# Patient Record
Sex: Male | Born: 1950 | Hispanic: No | Marital: Married | Smoking: Never smoker
Health system: Southern US, Community
[De-identification: ages and names within clinical notes are randomized; demographics above are authoritative.]

## PROBLEM LIST (undated history)

## (undated) DIAGNOSIS — K219 Gastro-esophageal reflux disease without esophagitis: Secondary | ICD-10-CM

---

## 2008-10-08 ENCOUNTER — Ambulatory Visit: Payer: Self-pay | Admitting: Diagnostic Radiology

## 2008-10-08 ENCOUNTER — Emergency Department (HOSPITAL_BASED_OUTPATIENT_CLINIC_OR_DEPARTMENT_OTHER): Admission: EM | Admit: 2008-10-08 | Discharge: 2008-10-08 | Payer: Self-pay | Admitting: Emergency Medicine

## 2010-07-01 ENCOUNTER — Encounter: Payer: Self-pay | Admitting: Specialist

## 2010-09-22 ENCOUNTER — Emergency Department (INDEPENDENT_AMBULATORY_CARE_PROVIDER_SITE_OTHER): Payer: Self-pay

## 2010-09-22 ENCOUNTER — Emergency Department (HOSPITAL_BASED_OUTPATIENT_CLINIC_OR_DEPARTMENT_OTHER)
Admission: EM | Admit: 2010-09-22 | Discharge: 2010-09-22 | Disposition: A | Discharge status: Home / Self Care | Payer: Self-pay | Attending: Emergency Medicine | Admitting: Emergency Medicine

## 2010-09-22 DIAGNOSIS — J45901 Unspecified asthma with (acute) exacerbation: Secondary | ICD-10-CM | POA: Insufficient documentation

## 2010-09-22 DIAGNOSIS — R0682 Tachypnea, not elsewhere classified: Secondary | ICD-10-CM | POA: Insufficient documentation

## 2010-09-22 DIAGNOSIS — R070 Pain in throat: Secondary | ICD-10-CM | POA: Insufficient documentation

## 2010-09-22 DIAGNOSIS — Z79899 Other long term (current) drug therapy: Secondary | ICD-10-CM | POA: Insufficient documentation

## 2010-09-22 DIAGNOSIS — R042 Hemoptysis: Secondary | ICD-10-CM | POA: Insufficient documentation

## 2010-09-22 DIAGNOSIS — R079 Chest pain, unspecified: Secondary | ICD-10-CM | POA: Insufficient documentation

## 2010-09-22 DIAGNOSIS — R0989 Other specified symptoms and signs involving the circulatory and respiratory systems: Secondary | ICD-10-CM | POA: Insufficient documentation

## 2010-09-22 DIAGNOSIS — R0609 Other forms of dyspnea: Secondary | ICD-10-CM | POA: Insufficient documentation

## 2010-09-22 DIAGNOSIS — J309 Allergic rhinitis, unspecified: Secondary | ICD-10-CM | POA: Insufficient documentation

## 2010-09-22 DIAGNOSIS — K219 Gastro-esophageal reflux disease without esophagitis: Secondary | ICD-10-CM | POA: Insufficient documentation

## 2010-09-22 DIAGNOSIS — R0602 Shortness of breath: Secondary | ICD-10-CM

## 2010-09-22 LAB — POCT I-STAT, CHEM 8
Creatinine, Ser: 1.1 mg/dL (ref 0.4–1.5)
Hemoglobin: 15.6 g/dL (ref 13.0–17.0)
Potassium: 4 mEq/L (ref 3.5–5.1)
Sodium: 140 mEq/L (ref 135–145)

## 2010-09-22 LAB — D-DIMER, QUANTITATIVE: D-Dimer, Quant: <0.22 ug/mL-FEU (ref 0.00–0.48)

## 2010-09-22 LAB — DIFFERENTIAL
Basophils Relative: 0 % (ref 0–1)
Eosinophils Absolute: 0.5 10*3/uL (ref 0.0–0.7)
Lymphocytes Relative: 46 % (ref 12–46)
Monocytes Absolute: 0.7 10*3/uL (ref 0.1–1.0)
Neutro Abs: 2.6 10*3/uL (ref 1.7–7.7)

## 2010-09-22 LAB — CBC
MCH: 29.8 pg (ref 26.0–34.0)
MCV: 84.2 fL (ref 78.0–100.0)
Platelets: 238 10*3/uL (ref 150–400)
RBC: 5.07 MIL/uL (ref 4.22–5.81)
RDW: 12.8 % (ref 11.5–15.5)

## 2010-09-22 MED ORDER — IOHEXOL 350 MG/ML SOLN
100.0000 mL | Freq: Once | INTRAVENOUS | Status: AC | PRN
Start: 2010-09-22 — End: 2010-09-22
  Administered 2010-09-22: 100 mL via INTRAVENOUS

## 2010-12-30 ENCOUNTER — Emergency Department (INDEPENDENT_AMBULATORY_CARE_PROVIDER_SITE_OTHER): Payer: Self-pay

## 2010-12-30 ENCOUNTER — Encounter: Payer: Self-pay | Admitting: Student

## 2010-12-30 ENCOUNTER — Emergency Department (HOSPITAL_BASED_OUTPATIENT_CLINIC_OR_DEPARTMENT_OTHER)
Admission: EM | Admit: 2010-12-30 | Discharge: 2010-12-30 | Disposition: A | Discharge status: Home / Self Care | Payer: Self-pay | Attending: Emergency Medicine | Admitting: Emergency Medicine

## 2010-12-30 DIAGNOSIS — R0602 Shortness of breath: Secondary | ICD-10-CM

## 2010-12-30 DIAGNOSIS — J45901 Unspecified asthma with (acute) exacerbation: Secondary | ICD-10-CM | POA: Insufficient documentation

## 2010-12-30 DIAGNOSIS — R05 Cough: Secondary | ICD-10-CM

## 2010-12-30 MED ORDER — IPRATROPIUM BROMIDE 0.02 % IN SOLN
0.5000 mg | Freq: Once | RESPIRATORY_TRACT | Status: AC
  Administered 2010-12-30: 0.5 mg via RESPIRATORY_TRACT
  Filled 2010-12-30: qty 2.5

## 2010-12-30 MED ORDER — ALBUTEROL SULFATE HFA 108 (90 BASE) MCG/ACT IN AERS
2.0000 | INHALATION_SPRAY | Freq: Once | RESPIRATORY_TRACT | Status: AC
  Administered 2010-12-30: 2 via RESPIRATORY_TRACT

## 2010-12-30 MED ORDER — PREDNISONE 20 MG PO TABS
50.0000 mg | ORAL_TABLET | Freq: Once | ORAL | Status: AC
  Administered 2010-12-30: 50 mg via ORAL
  Filled 2010-12-30: qty 2

## 2010-12-30 MED ORDER — PREDNISONE 10 MG PO TABS: ORAL_TABLET | ORAL | Status: DC

## 2010-12-30 MED ORDER — ALBUTEROL SULFATE (5 MG/ML) 0.5% IN NEBU
2.5000 mg | INHALATION_SOLUTION | Freq: Once | RESPIRATORY_TRACT | Status: AC
  Administered 2010-12-30: 5 mg via RESPIRATORY_TRACT
  Filled 2010-12-30: qty 0.5

## 2010-12-30 NOTE — ED Provider Notes (Signed)
History     Chief Complaint  Patient presents with  . Shortness of Breath   Patient is a 60 y.o. male presenting with shortness of breath. The history is provided by the patient. No language interpreter was used.  Shortness of Breath  The current episode started 2 days ago. The onset was sudden. The problem occurs occasionally. The problem has been gradually worsening (Episodes are occurring more frequently). The problem is moderate. The symptoms are relieved by beta-agonist inhalers. The symptoms are aggravated by nothing. Associated symptoms include shortness of breath and wheezing. Pertinent negatives include no chest pain, no fever, no rhinorrhea and no sore throat. There was no intake of a foreign body. He was not exposed to toxic fumes. He has not inhaled smoke recently. He has had intermittent steroid use (Patient last used steroids several months ago.). He has had no prior hospitalizations. He has had no prior ICU admissions. He has had no prior intubations. His past medical history is significant for past wheezing. His past medical history does not include asthma. He has been behaving normally. Urine output has been normal. There were no sick contacts. He has received no recent medical care.    Past Medical History  Diagnosis Date  . Asthma     History reviewed. No pertinent past surgical history.  History reviewed. No pertinent family history.  History  Substance Use Topics  . Smoking status: Never Smoker   . Smokeless tobacco: Never Used  . Alcohol Use: No      Review of Systems  Constitutional: Negative.  Negative for fever.  HENT: Negative.  Negative for sore throat and rhinorrhea.   Eyes: Negative.   Respiratory: Positive for shortness of breath and wheezing.   Cardiovascular: Negative for chest pain, palpitations and leg swelling.  Gastrointestinal: Negative.   Genitourinary: Negative.   Musculoskeletal: Negative.   Skin: Negative.   Neurological: Negative.     Hematological: Negative.   Psychiatric/Behavioral: Negative.   All other systems reviewed and are negative.    Physical Exam  BP 114/75  Pulse 81  Temp(Src) 98.9 F (37.2 C) (Oral)  Resp 20  SpO2 97%  Physical Exam  Nursing note and vitals reviewed. Constitutional: He is oriented to person, place, and time. He appears well-developed and well-nourished. No distress.  HENT:  Head: Normocephalic and atraumatic.  Eyes: Conjunctivae and EOM are normal. Pupils are equal, round, and reactive to light.  Neck: Normal range of motion.  Cardiovascular: Normal rate, regular rhythm and normal heart sounds.  Exam reveals no gallop and no friction rub.   No murmur heard. Pulmonary/Chest: Effort normal. No respiratory distress. He has wheezes.  Abdominal: Soft. Bowel sounds are normal. He exhibits no distension. There is no tenderness. There is no rebound and no guarding.  Musculoskeletal: Normal range of motion.  Neurological: He is alert and oriented to person, place, and time. No cranial nerve deficit.  Skin: Skin is warm and dry. No rash noted.  Psychiatric: He has a normal mood and affect.    ED Course  Procedures  MDM  Patient was examined by myself. Patient does not have a history of asthma that has been treated at this facility for bronchospasm in the past. Patient said he began having increasing frequency of bronchospastic attacks a couple of days ago. He has not been on any recent steroids. He denied any fevers. Patient does have a history of reflux and has been taking his medications for this. Patient has coughed up some  white phlegm with these episodes. A chest x-ray was ordered and was within normal limits. Patient did have prednisone 50 mg by mouth as well as 1 albuterol and Atrovent nebulizer treatment here. With this the patient said he was feeling much better. He had no audible wheezing on repeat assessment by myself. Patient was comfortable being discharged home. I did give  the patient an inhaler here and another 2 puffs of albuterol. He was also prescribed a prednisone taper. Patient was told that he should followup with his primary care physician in case additional controller medication should be added. The patient was discharged home in good condition.  Assessment: 60 year old male who presents with bronchospasm likely rate related to reactive airways disease.  Plan: Discharge home with albuterol, prednisone, and plan as dictated previously.     Aracelia Brinson 12/30/10 1703

## 2010-12-30 NOTE — ED Notes (Signed)
Pt in with c/o multiple asthma attacks unrelieved by multiple inhaler usage. Airway patent and intact, no cyanosis noted.

## 2011-10-29 ENCOUNTER — Emergency Department (HOSPITAL_BASED_OUTPATIENT_CLINIC_OR_DEPARTMENT_OTHER)
Admission: EM | Admit: 2011-10-29 | Discharge: 2011-10-29 | Disposition: A | Discharge status: Home / Self Care | Payer: Self-pay | Attending: Emergency Medicine | Admitting: Emergency Medicine

## 2011-10-29 ENCOUNTER — Encounter (HOSPITAL_BASED_OUTPATIENT_CLINIC_OR_DEPARTMENT_OTHER): Payer: Self-pay | Admitting: *Deleted

## 2011-10-29 ENCOUNTER — Emergency Department (HOSPITAL_BASED_OUTPATIENT_CLINIC_OR_DEPARTMENT_OTHER): Payer: Self-pay

## 2011-10-29 DIAGNOSIS — R0989 Other specified symptoms and signs involving the circulatory and respiratory systems: Secondary | ICD-10-CM | POA: Insufficient documentation

## 2011-10-29 DIAGNOSIS — R0602 Shortness of breath: Secondary | ICD-10-CM | POA: Insufficient documentation

## 2011-10-29 DIAGNOSIS — R0609 Other forms of dyspnea: Secondary | ICD-10-CM | POA: Insufficient documentation

## 2011-10-29 DIAGNOSIS — J45901 Unspecified asthma with (acute) exacerbation: Secondary | ICD-10-CM | POA: Insufficient documentation

## 2011-10-29 LAB — DIFFERENTIAL
Basophils Absolute: 0 10*3/uL (ref 0.0–0.1)
Basophils Relative: 0 % (ref 0–1)
Eosinophils Absolute: 0.4 10*3/uL (ref 0.0–0.7)
Eosinophils Relative: 4 % (ref 0–5)
Neutrophils Relative %: 65 % (ref 43–77)

## 2011-10-29 LAB — COMPREHENSIVE METABOLIC PANEL
ALT: 11 U/L (ref 0–53)
Albumin: 4.1 g/dL (ref 3.5–5.2)
Calcium: 9.2 mg/dL (ref 8.4–10.5)
GFR calc Af Amer: >90 mL/min (ref >90)
Glucose, Bld: 105 mg/dL — ABNORMAL HIGH (ref 70–99)
Potassium: 3.6 mEq/L (ref 3.5–5.1)
Sodium: 136 mEq/L (ref 135–145)
Total Protein: 7.1 g/dL (ref 6.0–8.3)

## 2011-10-29 LAB — CBC
MCH: 31.2 pg (ref 26.0–34.0)
MCHC: 35.9 g/dL (ref 30.0–36.0)
MCV: 86.8 fL (ref 78.0–100.0)
Platelets: 217 10*3/uL (ref 150–400)
RDW: 12.8 % (ref 11.5–15.5)

## 2011-10-29 MED ORDER — ALBUTEROL SULFATE (5 MG/ML) 0.5% IN NEBU
INHALATION_SOLUTION | RESPIRATORY_TRACT | Status: AC
  Filled 2011-10-29: qty 0.5

## 2011-10-29 MED ORDER — IPRATROPIUM BROMIDE 0.02 % IN SOLN
RESPIRATORY_TRACT | Status: AC
  Administered 2011-10-29: 0.5 mg via RESPIRATORY_TRACT
  Filled 2011-10-29: qty 2.5

## 2011-10-29 MED ORDER — METHYLPREDNISOLONE SODIUM SUCC 125 MG IJ SOLR
125.0000 mg | Freq: Once | INTRAMUSCULAR | Status: AC
  Administered 2011-10-29: 125 mg via INTRAVENOUS
  Filled 2011-10-29: qty 2

## 2011-10-29 MED ORDER — IPRATROPIUM BROMIDE 0.02 % IN SOLN
0.5000 mg | Freq: Once | RESPIRATORY_TRACT | Status: AC
  Administered 2011-10-29: 0.5 mg via RESPIRATORY_TRACT

## 2011-10-29 MED ORDER — ALBUTEROL SULFATE (5 MG/ML) 0.5% IN NEBU
5.0100 mg | INHALATION_SOLUTION | Freq: Once | RESPIRATORY_TRACT | Status: AC
  Administered 2011-10-29: 5 mg via RESPIRATORY_TRACT

## 2011-10-29 MED ORDER — PREDNISONE 10 MG PO TABS: 20.0000 mg | ORAL_TABLET | Freq: Two times a day (BID) | ORAL | Status: DC

## 2011-10-29 MED ORDER — ALBUTEROL SULFATE (5 MG/ML) 0.5% IN NEBU
INHALATION_SOLUTION | RESPIRATORY_TRACT | Status: AC
  Administered 2011-10-29: 5 mg via RESPIRATORY_TRACT
  Filled 2011-10-29: qty 1

## 2011-10-29 MED ORDER — AZITHROMYCIN 250 MG PO TABS: 250.0000 mg | ORAL_TABLET | Freq: Every day | ORAL | Status: AC

## 2011-10-29 MED ORDER — ALBUTEROL SULFATE (2.5 MG/3ML) 0.083% IN NEBU: 2.5000 mg | INHALATION_SOLUTION | Freq: Four times a day (QID) | RESPIRATORY_TRACT | Status: DC | PRN

## 2011-10-29 MED ORDER — ALBUTEROL SULFATE (5 MG/ML) 0.5% IN NEBU
10.0000 mg | INHALATION_SOLUTION | RESPIRATORY_TRACT | Status: DC
  Administered 2011-10-29: 10 mg via RESPIRATORY_TRACT

## 2011-10-29 NOTE — ED Notes (Signed)
C/o asthma exacerbation, no relief from inhalers

## 2011-10-29 NOTE — ED Provider Notes (Signed)
History     CSN: [id number]  Arrival date & time 10/29/11  2426   First MD Initiated Contact with Patient 10/29/11 0703      Chief Complaint  Patient presents with  . Asthma    (Consider location/radiation/quality/duration/timing/severity/associated sxs/prior treatment) HPI Comments: History of Asthma.  Flares up every so often.  On steroids a couple months back.  Has cough which is productive.  Patient is a 61 y.o. male presenting with asthma. The history is provided by the patient.  Asthma This is a recurrent problem. The current episode started 2 days ago. The problem occurs constantly. The problem has been gradually worsening. Associated symptoms include shortness of breath. Pertinent negatives include no chest pain. The symptoms are aggravated by nothing. The symptoms are relieved by nothing. Treatments tried: inhalers. The treatment provided mild relief.    Past Medical History  Diagnosis Date  . Asthma     History reviewed. No pertinent past surgical history.  History reviewed. No pertinent family history.  History  Substance Use Topics  . Smoking status: Never Smoker   . Smokeless tobacco: Never Used  . Alcohol Use: No      Review of Systems  Respiratory: Positive for shortness of breath.   Cardiovascular: Negative for chest pain.  All other systems reviewed and are negative.    Allergies  Review of patient's allergies indicates no known allergies.  Home Medications   Current Outpatient Rx  Name Route Sig Dispense Refill  . VENTOLIN HFA IN Inhalation Inhale into the lungs.      . ESOMEPRAZOLE MAGNESIUM 40 MG PO CPDR Oral Take 40 mg by mouth daily before breakfast.      . COMBIVENT IN Inhalation Inhale into the lungs.      Marland Kitchen PREDNISONE 10 MG PO TABS  Take 5 tabs po qday x 2 days, then 4 tabs po qday x 2 days, then 3 tabs po qday x 2 days, then 2 tabs po qday x 2 days, then 1 tab po qday x 2 days, then 1/2 tab po qday x 2 days, then STOP. 31 tablet 0     BP 145/87  Pulse 104  Temp(Src) 98.1 F (36.7 C) (Oral)  Resp 26  SpO2 96%  Physical Exam  Nursing note and vitals reviewed. Constitutional: He is oriented to person, place, and time. He appears well-developed and well-nourished. No distress.  HENT:  Head: Normocephalic and atraumatic.  Mouth/Throat: Oropharynx is clear and moist.  Neck: Normal range of motion. Neck supple.  Cardiovascular: Normal rate and regular rhythm.   Pulmonary/Chest:       Mild respiratory distress.  Rhonchorous breath sounds throughout.  Abdominal: Soft. Bowel sounds are normal. He exhibits no distension. There is no tenderness.  Musculoskeletal: Normal range of motion. He exhibits no edema.  Lymphadenopathy:    He has no cervical adenopathy.  Neurological: He is alert and oriented to person, place, and time.  Skin: Skin is warm and dry. He is not diaphoretic.    ED Course  Procedures (including critical care time)   Labs Reviewed  CBC  DIFFERENTIAL  COMPREHENSIVE METABOLIC PANEL  PRO B NATRIURETIC PEPTIDE  TROPONIN I   No results found.   No diagnosis found.    MDM  The patient appears better and is feeling better after the neb.  The chest xray and labs are all consistent with bronchitis/rad exac rather than a pneumonia or cardiac etiology.  Will treat with bronchodilators and try to arrange for  a home neb machine.  Will also prescribe prednisone and zmax.  To return prn.        Geoffery Lyons, MD 10/29/11 [phone]

## 2011-10-29 NOTE — ED Notes (Signed)
Report received from [place], [place], care assumed.  Dr. Judd Lien at bedside.

## 2011-10-29 NOTE — ED Notes (Signed)
Report to Amy, RN

## 2011-10-29 NOTE — Discharge Instructions (Signed)

## 2011-10-29 NOTE — ED Notes (Signed)
Pt continues to receive neb tx and improving.

## 2011-11-25 ENCOUNTER — Emergency Department (HOSPITAL_BASED_OUTPATIENT_CLINIC_OR_DEPARTMENT_OTHER): Payer: Self-pay

## 2011-11-25 ENCOUNTER — Encounter (HOSPITAL_BASED_OUTPATIENT_CLINIC_OR_DEPARTMENT_OTHER): Payer: Self-pay | Admitting: *Deleted

## 2011-11-25 ENCOUNTER — Emergency Department (HOSPITAL_BASED_OUTPATIENT_CLINIC_OR_DEPARTMENT_OTHER)
Admission: EM | Admit: 2011-11-25 | Discharge: 2011-11-25 | Disposition: A | Discharge status: Home / Self Care | Payer: Self-pay | Attending: Emergency Medicine | Admitting: Emergency Medicine

## 2011-11-25 DIAGNOSIS — K219 Gastro-esophageal reflux disease without esophagitis: Secondary | ICD-10-CM | POA: Insufficient documentation

## 2011-11-25 DIAGNOSIS — J45909 Unspecified asthma, uncomplicated: Secondary | ICD-10-CM | POA: Insufficient documentation

## 2011-11-25 DIAGNOSIS — Z79899 Other long term (current) drug therapy: Secondary | ICD-10-CM | POA: Insufficient documentation

## 2011-11-25 HISTORY — DX: Gastro-esophageal reflux disease without esophagitis: K21.9

## 2011-11-25 MED ORDER — PANTOPRAZOLE SODIUM 20 MG PO TBEC: 40.0000 mg | DELAYED_RELEASE_TABLET | Freq: Every day | ORAL | Status: DC

## 2011-11-25 MED ORDER — ALBUTEROL SULFATE (5 MG/ML) 0.5% IN NEBU
10.0000 mg | INHALATION_SOLUTION | Freq: Once | RESPIRATORY_TRACT | Status: AC
  Administered 2011-11-25: 10 mg via RESPIRATORY_TRACT
  Filled 2011-11-25: qty 2

## 2011-11-25 MED ORDER — RACEPINEPHRINE HCL 2.25 % IN NEBU
INHALATION_SOLUTION | RESPIRATORY_TRACT | Status: AC
  Administered 2011-11-25: 0.5 mL via RESPIRATORY_TRACT
  Filled 2011-11-25: qty 0.5

## 2011-11-25 MED ORDER — ALBUTEROL SULFATE (5 MG/ML) 0.5% IN NEBU
INHALATION_SOLUTION | RESPIRATORY_TRACT | Status: AC
  Filled 2011-11-25: qty 0.5

## 2011-11-25 MED ORDER — RACEPINEPHRINE HCL 2.25 % IN NEBU: 0.5000 mL | INHALATION_SOLUTION | RESPIRATORY_TRACT | Status: DC | PRN

## 2011-11-25 MED ORDER — IPRATROPIUM BROMIDE 0.02 % IN SOLN
RESPIRATORY_TRACT | Status: AC
  Filled 2011-11-25: qty 2.5

## 2011-11-25 MED ORDER — PREDNISONE 50 MG PO TABS
60.0000 mg | ORAL_TABLET | Freq: Once | ORAL | Status: AC
  Administered 2011-11-25: 60 mg via ORAL
  Filled 2011-11-25: qty 1

## 2011-11-25 MED ORDER — RACEPINEPHRINE HCL 2.25 % IN NEBU
0.5000 mL | INHALATION_SOLUTION | Freq: Once | RESPIRATORY_TRACT | Status: AC
  Administered 2011-11-25: 0.5 mL via RESPIRATORY_TRACT

## 2011-11-25 MED ORDER — METHYLPREDNISOLONE SODIUM SUCC 125 MG IJ SOLR
125.0000 mg | Freq: Once | INTRAMUSCULAR | Status: AC
  Administered 2011-11-25: 125 mg via INTRAVENOUS
  Filled 2011-11-25: qty 2

## 2011-11-25 MED ORDER — IPRATROPIUM BROMIDE 0.02 % IN SOLN
0.5000 mg | Freq: Once | RESPIRATORY_TRACT | Status: AC
  Administered 2011-11-25: 0.5 mg via RESPIRATORY_TRACT

## 2011-11-25 MED ORDER — PREDNISONE 20 MG PO TABS: 40.0000 mg | ORAL_TABLET | Freq: Every day | ORAL | Status: AC

## 2011-11-25 NOTE — ED Notes (Signed)
MD at bedside. 

## 2011-11-25 NOTE — ED Provider Notes (Signed)
History     CSN: [id number]  Arrival date & time 11/25/11  1219   First MD Initiated Contact with Patient 11/25/11 1231      Chief Complaint  Patient presents with  . Asthma  . Shortness of Breath    (Consider location/radiation/quality/duration/timing/severity/associated sxs/prior treatment) HPI Patient with known history of asthma comes in with increasing wheezing and shortness of breath.  Patient albuterol at home with no improvement. Past Medical History  Diagnosis Date  . Asthma   . GERD (gastroesophageal reflux disease)     History reviewed. No pertinent past surgical history.  History reviewed. No pertinent family history.  History  Substance Use Topics  . Smoking status: Never Smoker   . Smokeless tobacco: Never Used  . Alcohol Use: No      Review of Systems  Unable to perform ROS   Allergies  Review of patient's allergies indicates no known allergies.  Home Medications   Current Outpatient Rx  Name Route Sig Dispense Refill  . FLUTICASONE-SALMETEROL 100-50 MCG/DOSE IN AEPB Inhalation Inhale 1 puff into the lungs every 12 (twelve) hours.    . ALBUTEROL SULFATE (2.5 MG/3ML) 0.083% IN NEBU Nebulization Take 3 mLs (2.5 mg total) by nebulization every 6 (six) hours as needed for wheezing. 25 mL 12  . VENTOLIN HFA IN Inhalation Inhale into the lungs.      . ESOMEPRAZOLE MAGNESIUM 40 MG PO CPDR Oral Take 40 mg by mouth daily before breakfast.      . COMBIVENT IN Inhalation Inhale into the lungs.      Marland Kitchen PANTOPRAZOLE SODIUM 20 MG PO TBEC Oral Take 2 tablets (40 mg total) by mouth daily. 15 tablet 1  . PREDNISONE 10 MG PO TABS  Take 5 tabs po qday x 2 days, then 4 tabs po qday x 2 days, then 3 tabs po qday x 2 days, then 2 tabs po qday x 2 days, then 1 tab po qday x 2 days, then 1/2 tab po qday x 2 days, then STOP. 31 tablet 0  . PREDNISONE 10 MG PO TABS Oral Take 2 tablets (20 mg total) by mouth 2 (two) times daily. 20 tablet 0  . PREDNISONE 20 MG PO TABS Oral  Take 2 tablets (40 mg total) by mouth daily. 10 tablet 0  . RACEPINEPHRINE HCL 2.25 % IN NEBU Nebulization Take 0.5 mLs by nebulization every 4 (four) hours as needed. Mix with 2cc normal saline 15 mL 0    BP 116/68  Pulse 102  SpO2 96%  Physical Exam  Nursing note and vitals reviewed. Constitutional: He is oriented to person, place, and time. He appears well-developed and well-nourished. No distress.  HENT:  Head: Normocephalic and atraumatic.  Eyes: Pupils are equal, round, and reactive to light.  Neck: Normal range of motion.  Cardiovascular: Normal rate and intact distal pulses.   Pulmonary/Chest: Accessory muscle usage present. Tachypnea noted. He is in respiratory distress. He has wheezes.  Abdominal: Normal appearance. He exhibits no distension.  Musculoskeletal: Normal range of motion.  Neurological: He is alert and oriented to person, place, and time. No cranial nerve deficit.  Skin: Skin is warm and dry. No rash noted.  Psychiatric: He has a normal mood and affect. His behavior is normal.   Scheduled Meds:    . albuterol  10 mg Nebulization Once  . ipratropium  0.5 mg Nebulization Once  . methylPREDNISolone (SOLU-MEDROL) injection  125 mg Intravenous Once  . predniSONE  60 mg Oral Once  .  Racepinephrine HCl  0.5 mL Nebulization Once  . Racepinephrine HCl  0.5 mL Nebulization Once  . DISCONTD: albuterol      . DISCONTD: ipratropium       Continuous Infusions:  PRN Meds:.  ED Course  Procedures (including critical care time)  Labs Reviewed - No data to display Dg Chest 2 View  11/25/2011  *RADIOLOGY REPORT*  Clinical Data:  Shortness of breath, cough  CHEST - 2 VIEW  Comparison: 10/29/2011  Findings: Cardiomediastinal silhouette is stable.  Mild hyperinflation again noted.  Central mild bronchitic changes.  No acute infiltrate or pulmonary edema.  Stable mild degenerative changes thoracic spine.  IMPRESSION: Mild hyperinflation.  No acute infiltrate or pulmonary  edema. Central mild bronchitic changes.  Original Report Authenticated By: Natasha Mead, M.D.     1. Asthma       MDM  After treatment in the ED the patient feels back to baseline and wants to go home.        Nelia Shi, MD 11/25/11 [phone]

## 2011-11-25 NOTE — ED Notes (Signed)
Up all night coughing and using nebulizer continued to get more short of breath and wheeze worse today denies any fever or chills

## 2012-01-01 ENCOUNTER — Institutional Professional Consult (permissible substitution): Payer: Self-pay | Admitting: Pulmonary Disease

## 2012-12-09 ENCOUNTER — Encounter (HOSPITAL_BASED_OUTPATIENT_CLINIC_OR_DEPARTMENT_OTHER): Payer: Self-pay | Admitting: *Deleted

## 2012-12-09 ENCOUNTER — Emergency Department (HOSPITAL_BASED_OUTPATIENT_CLINIC_OR_DEPARTMENT_OTHER)
Admission: EM | Admit: 2012-12-09 | Discharge: 2012-12-09 | Disposition: A | Discharge status: Home / Self Care | Payer: 59 | Attending: Emergency Medicine | Admitting: Emergency Medicine

## 2012-12-09 ENCOUNTER — Emergency Department (HOSPITAL_BASED_OUTPATIENT_CLINIC_OR_DEPARTMENT_OTHER): Payer: 59

## 2012-12-09 DIAGNOSIS — S336XXA Sprain of sacroiliac joint, initial encounter: Secondary | ICD-10-CM | POA: Insufficient documentation

## 2012-12-09 DIAGNOSIS — S39012A Strain of muscle, fascia and tendon of lower back, initial encounter: Secondary | ICD-10-CM

## 2012-12-09 DIAGNOSIS — J45909 Unspecified asthma, uncomplicated: Secondary | ICD-10-CM | POA: Insufficient documentation

## 2012-12-09 DIAGNOSIS — Y92410 Unspecified street and highway as the place of occurrence of the external cause: Secondary | ICD-10-CM | POA: Insufficient documentation

## 2012-12-09 DIAGNOSIS — S40019A Contusion of unspecified shoulder, initial encounter: Secondary | ICD-10-CM | POA: Insufficient documentation

## 2012-12-09 DIAGNOSIS — S8010XA Contusion of unspecified lower leg, initial encounter: Secondary | ICD-10-CM | POA: Insufficient documentation

## 2012-12-09 DIAGNOSIS — Y9389 Activity, other specified: Secondary | ICD-10-CM | POA: Insufficient documentation

## 2012-12-09 DIAGNOSIS — Z7901 Long term (current) use of anticoagulants: Secondary | ICD-10-CM | POA: Insufficient documentation

## 2012-12-09 DIAGNOSIS — K219 Gastro-esophageal reflux disease without esophagitis: Secondary | ICD-10-CM | POA: Insufficient documentation

## 2012-12-09 DIAGNOSIS — T07XXXA Unspecified multiple injuries, initial encounter: Secondary | ICD-10-CM

## 2012-12-09 DIAGNOSIS — R079 Chest pain, unspecified: Secondary | ICD-10-CM | POA: Insufficient documentation

## 2012-12-09 DIAGNOSIS — Z79899 Other long term (current) drug therapy: Secondary | ICD-10-CM | POA: Insufficient documentation

## 2012-12-09 MED ORDER — HYDROCODONE-ACETAMINOPHEN 5-325 MG PO TABS: 2.0000 | ORAL_TABLET | ORAL | Status: DC | PRN
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
## 2012-12-09 NOTE — ED Notes (Signed)
Pt also reports left shoulder "burning".

## 2012-12-13 ENCOUNTER — Encounter (HOSPITAL_BASED_OUTPATIENT_CLINIC_OR_DEPARTMENT_OTHER): Payer: Self-pay | Admitting: *Deleted

## 2012-12-13 ENCOUNTER — Ambulatory Visit (INDEPENDENT_AMBULATORY_CARE_PROVIDER_SITE_OTHER): Payer: Self-pay | Admitting: Family Medicine

## 2012-12-13 ENCOUNTER — Emergency Department (HOSPITAL_BASED_OUTPATIENT_CLINIC_OR_DEPARTMENT_OTHER)
Admission: EM | Admit: 2012-12-13 | Discharge: 2012-12-13 | Disposition: A | Discharge status: Home / Self Care | Payer: 59 | Attending: Emergency Medicine | Admitting: Emergency Medicine

## 2012-12-13 ENCOUNTER — Encounter: Payer: Self-pay | Admitting: Family Medicine

## 2012-12-13 VITALS — BP 117/78 | HR 58 | Ht 66.0 in | Wt 146.0 lb

## 2012-12-13 DIAGNOSIS — Z79899 Other long term (current) drug therapy: Secondary | ICD-10-CM | POA: Insufficient documentation

## 2012-12-13 DIAGNOSIS — Z87828 Personal history of other (healed) physical injury and trauma: Secondary | ICD-10-CM | POA: Insufficient documentation

## 2012-12-13 DIAGNOSIS — M545 Low back pain, unspecified: Secondary | ICD-10-CM

## 2012-12-13 DIAGNOSIS — M5441 Lumbago with sciatica, right side: Secondary | ICD-10-CM

## 2012-12-13 DIAGNOSIS — R52 Pain, unspecified: Secondary | ICD-10-CM | POA: Insufficient documentation

## 2012-12-13 DIAGNOSIS — M5430 Sciatica, unspecified side: Secondary | ICD-10-CM

## 2012-12-13 DIAGNOSIS — IMO0002 Reserved for concepts with insufficient information to code with codable children: Secondary | ICD-10-CM | POA: Insufficient documentation

## 2012-12-13 DIAGNOSIS — J45909 Unspecified asthma, uncomplicated: Secondary | ICD-10-CM | POA: Insufficient documentation

## 2012-12-13 DIAGNOSIS — K219 Gastro-esophageal reflux disease without esophagitis: Secondary | ICD-10-CM | POA: Insufficient documentation

## 2012-12-13 MED ORDER — CYCLOBENZAPRINE HCL 10 MG PO TABS: 10.0000 mg | ORAL_TABLET | Freq: Three times a day (TID) | ORAL | Status: AC | PRN

## 2012-12-13 MED ORDER — HYDROCODONE-ACETAMINOPHEN 5-325 MG PO TABS: 1.0000 | ORAL_TABLET | Freq: Four times a day (QID) | ORAL | Status: DC | PRN

## 2012-12-13 MED ORDER — MELOXICAM 15 MG PO TABS: 15.0000 mg | ORAL_TABLET | Freq: Every day | ORAL | Status: DC

## 2012-12-13 MED ORDER — HYDROCODONE-ACETAMINOPHEN 5-325 MG PO TABS: 2.0000 | ORAL_TABLET | ORAL | Status: DC | PRN

## 2012-12-13 MED ORDER — PREDNISONE 20 MG PO TABS: 40.0000 mg | ORAL_TABLET | Freq: Every day | ORAL | Status: DC
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
## 2012-12-13 NOTE — ED Provider Notes (Signed)
Medical screening examination/treatment/procedure(s) were performed by non-physician practitioner and as supervising physician I was immediately available for consultation/collaboration.   Carleene Cooper III, MD 12/13/12 [phone]

## 2012-12-14 ENCOUNTER — Encounter: Payer: Self-pay | Admitting: Family Medicine

## 2012-12-14 DIAGNOSIS — M545 Low back pain: Secondary | ICD-10-CM | POA: Insufficient documentation
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
## 2012-12-15 ENCOUNTER — Ambulatory Visit: Payer: 59 | Attending: Family Medicine | Admitting: Rehabilitation

## 2012-12-15 DIAGNOSIS — M545 Low back pain, unspecified: Secondary | ICD-10-CM | POA: Insufficient documentation

## 2012-12-15 DIAGNOSIS — IMO0001 Reserved for inherently not codable concepts without codable children: Secondary | ICD-10-CM | POA: Insufficient documentation

## 2012-12-20 ENCOUNTER — Encounter: Payer: Self-pay | Admitting: Family Medicine

## 2012-12-20 ENCOUNTER — Ambulatory Visit (INDEPENDENT_AMBULATORY_CARE_PROVIDER_SITE_OTHER): Payer: Self-pay | Admitting: Family Medicine

## 2012-12-20 VITALS — BP 117/77 | HR 71 | Ht 66.0 in | Wt 145.0 lb

## 2012-12-20 DIAGNOSIS — M545 Low back pain, unspecified: Secondary | ICD-10-CM

## 2012-12-21 ENCOUNTER — Ambulatory Visit: Payer: 59 | Admitting: Rehabilitation

## 2012-12-21 ENCOUNTER — Ambulatory Visit (INDEPENDENT_AMBULATORY_CARE_PROVIDER_SITE_OTHER): Payer: 59

## 2012-12-21 ENCOUNTER — Encounter: Payer: Self-pay | Admitting: Family Medicine

## 2012-12-21 DIAGNOSIS — M5126 Other intervertebral disc displacement, lumbar region: Secondary | ICD-10-CM

## 2012-12-21 DIAGNOSIS — M545 Low back pain: Secondary | ICD-10-CM
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
## 2012-12-23 ENCOUNTER — Ambulatory Visit: Payer: 59 | Admitting: Rehabilitation

## 2012-12-23 ENCOUNTER — Other Ambulatory Visit: Payer: Self-pay | Admitting: Family Medicine

## 2012-12-23 ENCOUNTER — Encounter: Payer: Self-pay | Admitting: Family Medicine

## 2012-12-23 DIAGNOSIS — M5416 Radiculopathy, lumbar region: Secondary | ICD-10-CM

## 2012-12-27 ENCOUNTER — Ambulatory Visit
Admission: RE | Admit: 2012-12-27 | Discharge: 2012-12-27 | Disposition: A | Discharge status: Home / Self Care | Payer: 59 | Source: Ambulatory Visit | Attending: Family Medicine | Admitting: Family Medicine

## 2012-12-27 VITALS — BP 127/73 | HR 57

## 2012-12-27 DIAGNOSIS — M5416 Radiculopathy, lumbar region: Secondary | ICD-10-CM

## 2012-12-27 MED ORDER — IOHEXOL 180 MG/ML  SOLN
1.0000 mL | Freq: Once | INTRAMUSCULAR | Status: AC | PRN
  Administered 2012-12-27: 1 mL via EPIDURAL

## 2012-12-27 MED ORDER — METHYLPREDNISOLONE ACETATE 40 MG/ML INJ SUSP (RADIOLOG
120.0000 mg | Freq: Once | INTRAMUSCULAR | Status: AC
  Administered 2012-12-27: 120 mg via EPIDURAL

## 2012-12-28 ENCOUNTER — Ambulatory Visit: Payer: 59 | Attending: Family Medicine | Admitting: Rehabilitation

## 2012-12-28 DIAGNOSIS — M545 Low back pain, unspecified: Secondary | ICD-10-CM | POA: Insufficient documentation

## 2012-12-28 DIAGNOSIS — IMO0001 Reserved for inherently not codable concepts without codable children: Secondary | ICD-10-CM | POA: Insufficient documentation

## 2012-12-31 ENCOUNTER — Ambulatory Visit: Payer: 59 | Admitting: Rehabilitation

## 2013-01-05 ENCOUNTER — Ambulatory Visit: Payer: 59 | Admitting: Rehabilitation

## 2013-01-14 ENCOUNTER — Ambulatory Visit: Payer: 59 | Attending: Family Medicine | Admitting: Rehabilitation

## 2013-01-14 DIAGNOSIS — IMO0001 Reserved for inherently not codable concepts without codable children: Secondary | ICD-10-CM | POA: Insufficient documentation

## 2013-01-14 DIAGNOSIS — M545 Low back pain, unspecified: Secondary | ICD-10-CM | POA: Insufficient documentation

## 2013-01-21 ENCOUNTER — Ambulatory Visit: Payer: 59 | Admitting: Rehabilitation

## 2013-01-24 ENCOUNTER — Ambulatory Visit: Payer: 59 | Admitting: Family Medicine

## 2013-01-28 ENCOUNTER — Ambulatory Visit: Payer: 59 | Admitting: Rehabilitation

## 2015-10-29 ENCOUNTER — Encounter (HOSPITAL_BASED_OUTPATIENT_CLINIC_OR_DEPARTMENT_OTHER): Payer: Self-pay | Admitting: *Deleted

## 2015-10-29 ENCOUNTER — Emergency Department (HOSPITAL_BASED_OUTPATIENT_CLINIC_OR_DEPARTMENT_OTHER)
Admission: EM | Admit: 2015-10-29 | Discharge: 2015-10-29 | Disposition: A | Discharge status: Home / Self Care | Payer: Self-pay | Attending: Emergency Medicine | Admitting: Emergency Medicine

## 2015-10-29 ENCOUNTER — Emergency Department (HOSPITAL_BASED_OUTPATIENT_CLINIC_OR_DEPARTMENT_OTHER): Payer: Self-pay

## 2015-10-29 DIAGNOSIS — J45901 Unspecified asthma with (acute) exacerbation: Secondary | ICD-10-CM | POA: Insufficient documentation

## 2015-10-29 LAB — CBC
HEMATOCRIT: 47.3 % (ref 39.0–52.0)
HEMOGLOBIN: 16.3 g/dL (ref 13.0–17.0)
MCH: 31 pg (ref 26.0–34.0)
MCHC: 34.5 g/dL (ref 30.0–36.0)
MCV: 89.9 fL (ref 78.0–100.0)
PLATELETS: 236 10*3/uL (ref 150–400)
RBC: 5.26 MIL/uL (ref 4.22–5.81)
RDW: 13.9 % (ref 11.5–15.5)
WBC: 8 10*3/uL (ref 4.0–10.5)

## 2015-10-29 LAB — BASIC METABOLIC PANEL
ANION GAP: 7 (ref 5–15)
BUN: 18 mg/dL (ref 6–20)
CHLORIDE: 106 mmol/L (ref 101–111)
CO2: 26 mmol/L (ref 22–32)
Calcium: 9.1 mg/dL (ref 8.9–10.3)
Creatinine, Ser: 1.12 mg/dL (ref 0.61–1.24)
GFR calc Af Amer: >60 mL/min (ref >60)
GLUCOSE: 103 mg/dL — AB (ref 65–99)
POTASSIUM: 4 mmol/L (ref 3.5–5.1)
Sodium: 139 mmol/L (ref 135–145)

## 2015-10-29 MED ORDER — ALBUTEROL (5 MG/ML) CONTINUOUS INHALATION SOLN
10.0000 mg/h | INHALATION_SOLUTION | Freq: Once | RESPIRATORY_TRACT | Status: AC
  Administered 2015-10-29: 10 mg/h via RESPIRATORY_TRACT
  Filled 2015-10-29: qty 20

## 2015-10-29 MED ORDER — PANTOPRAZOLE SODIUM 40 MG IV SOLR
40.0000 mg | Freq: Once | INTRAVENOUS | Status: AC
  Administered 2015-10-29: 40 mg via INTRAVENOUS
  Filled 2015-10-29: qty 40

## 2015-10-29 MED ORDER — ALBUTEROL (5 MG/ML) CONTINUOUS INHALATION SOLN
10.0000 mg/h | INHALATION_SOLUTION | Freq: Once | RESPIRATORY_TRACT | Status: AC
  Administered 2015-10-29: 10 mg/h via RESPIRATORY_TRACT

## 2015-10-29 MED ORDER — GI COCKTAIL ~~LOC~~
30.0000 mL | Freq: Once | ORAL | Status: AC
  Administered 2015-10-29: 30 mL via ORAL
  Filled 2015-10-29: qty 30

## 2015-10-29 MED ORDER — METHYLPREDNISOLONE SODIUM SUCC 125 MG IJ SOLR
125.0000 mg | Freq: Once | INTRAMUSCULAR | Status: AC
  Administered 2015-10-29: 125 mg via INTRAVENOUS
  Filled 2015-10-29: qty 2

## 2015-10-29 MED ORDER — PREDNISONE 20 MG PO TABS: 40.0000 mg | ORAL_TABLET | Freq: Every day | ORAL | Status: DC

## 2015-10-29 MED ORDER — ALBUTEROL SULFATE HFA 108 (90 BASE) MCG/ACT IN AERS
1.0000 | INHALATION_SPRAY | RESPIRATORY_TRACT | Status: DC | PRN
  Administered 2015-10-29: 2 via RESPIRATORY_TRACT
  Filled 2015-10-29: qty 6.7

## 2015-10-29 MED ORDER — ALBUTEROL SULFATE (2.5 MG/3ML) 0.083% IN NEBU
5.0000 mg | INHALATION_SOLUTION | Freq: Once | RESPIRATORY_TRACT | Status: AC
  Administered 2015-10-29: 5 mg via RESPIRATORY_TRACT
  Filled 2015-10-29: qty 6

## 2015-10-29 MED ORDER — FLUTICASONE-SALMETEROL 100-50 MCG/DOSE IN AEPB: 1.0000 | INHALATION_SPRAY | Freq: Two times a day (BID) | RESPIRATORY_TRACT | Status: DC

## 2015-10-29 MED ORDER — ALBUTEROL SULFATE (2.5 MG/3ML) 0.083% IN NEBU: 2.5000 mg | INHALATION_SOLUTION | Freq: Four times a day (QID) | RESPIRATORY_TRACT | Status: AC | PRN

## 2015-10-29 MED ORDER — PANTOPRAZOLE SODIUM 20 MG PO TBEC: 40.0000 mg | DELAYED_RELEASE_TABLET | Freq: Every day | ORAL | Status: DC

## 2015-10-29 MED ORDER — IPRATROPIUM BROMIDE 0.02 % IN SOLN
0.5000 mg | Freq: Once | RESPIRATORY_TRACT | Status: AC
  Administered 2015-10-29: 0.5 mg via RESPIRATORY_TRACT
  Filled 2015-10-29: qty 2.5

## 2015-10-29 NOTE — Discharge Instructions (Signed)
You were seen and evaluated today for your shortness of breath and your reflux. Please take the medications prescribed to help control your reflux and also help with your breathing. Please follow-up outpatient as soon as possible with the primary care physician. If your breathing worsens please return immediately to the emergency department.  Asthma, Adult Asthma is a condition of the lungs in which the airways tighten and narrow. Asthma can make it hard to breathe. Asthma cannot be cured, but medicine and lifestyle changes can help control it. Asthma may be started (triggered) by:  Animal skin flakes (dander).  Dust.  Cockroaches.  Pollen.  Mold.  Smoke.  Cleaning products.  Hair sprays or aerosol sprays.  Paint fumes or strong smells.  Cold air, weather changes, and winds.  Crying or laughing hard.  Stress.  Certain medicines or drugs.  Foods, such as dried fruit, potato chips, and sparkling grape juice.  Infections or conditions (colds, flu).  Exercise.  Certain medical conditions or diseases.  Exercise or tiring activities. HOME CARE   Take medicine as told by your doctor.  Use a peak flow meter as told by your doctor. A peak flow meter is a tool that measures how well the lungs are working.  Record and keep track of the peak flow meter's readings.  Understand and use the asthma action plan. An asthma action plan is a written plan for taking care of your asthma and treating your attacks.  To help prevent asthma attacks:  Do not smoke. Stay away from secondhand smoke.  Change your heating and air conditioning filter often.  Limit your use of fireplaces and wood stoves.  Get rid of pests (such as roaches and mice) and their droppings.  Throw away plants if you see mold on them.  Clean your floors. Dust regularly. Use cleaning products that do not smell.  Have someone vacuum when you are not home. Use a vacuum cleaner with a HEPA filter if  possible.  Replace carpet with wood, tile, or vinyl flooring. Carpet can trap animal skin flakes and dust.  Use allergy-proof pillows, mattress covers, and box spring covers.  Wash bed sheets and blankets every week in hot water and dry them in a dryer.  Use blankets that are made of polyester or cotton.  Clean bathrooms and kitchens with bleach. If possible, have someone repaint the walls in these rooms with mold-resistant paint. Keep out of the rooms that are being cleaned and painted.  Wash hands often. GET HELP IF:  You have make a whistling sound when breaking (wheeze), have shortness of breath, or have a cough even if taking medicine to prevent attacks.  The colored mucus you cough up (sputum) is thicker than usual.  The colored mucus you cough up changes from clear or white to yellow, green, gray, or bloody.  You have problems from the medicine you are taking such as:  A rash.  Itching.  Swelling.  Trouble breathing.  You need reliever medicines more than 2-3 times a week.  Your peak flow measurement is still at 50-79% of your personal best after following the action plan for 1 hour.  You have a fever. GET HELP RIGHT AWAY IF:   You seem to be worse and are not responding to medicine during an asthma attack.  You are short of breath even at rest.  You get short of breath when doing very little activity.  You have trouble eating, drinking, or talking.  You have chest pain.  You have a fast heartbeat.  Your lips or fingernails start to turn blue.  You are light-headed, dizzy, or faint.  Your peak flow is less than 50% of your personal best.   This information is not intended to replace advice given to you by your health care provider. Make sure you discuss any questions you have with your health care provider.   Document Released: 11/12/2007 Document Revised: 02/14/2015 Document Reviewed: 12/23/2012 Elsevier Interactive Patient Education 2016 [employer]  Gastroesophageal Reflux Disease, Adult Normally, food travels down the esophagus and stays in the stomach to be digested. However, when a person has gastroesophageal reflux disease (GERD), food and stomach acid move back up into the esophagus. When this happens, the esophagus becomes sore and inflamed. Over time, GERD can create small holes (ulcers) in the lining of the esophagus.  CAUSES This condition is caused by a problem with the muscle between the esophagus and the stomach (lower esophageal sphincter, or LES). Normally, the LES muscle closes after food passes through the esophagus to the stomach. When the LES is weakened or abnormal, it does not close properly, and that allows food and stomach acid to go back up into the esophagus. The LES can be weakened by certain dietary substances, medicines, and medical conditions, including:  Tobacco use.  Pregnancy.  Having a hiatal hernia.  Heavy alcohol use.  Certain foods and beverages, such as coffee, chocolate, onions, and peppermint. RISK FACTORS This condition is more likely to develop in:  People who have an increased body weight.  People who have connective tissue disorders.  People who use NSAID medicines. SYMPTOMS Symptoms of this condition include:  Heartburn.  Difficult or painful swallowing.  The feeling of having a lump in the throat.  Abitter taste in the mouth.  Bad breath.  Having a large amount of saliva.  Having an upset or bloated stomach.  Belching.  Chest pain.  Shortness of breath or wheezing.  Ongoing (chronic) cough or a night-time cough.  Wearing away of tooth enamel.  Weight loss. Different conditions can cause chest pain. Make sure to see your health care provider if you experience chest pain. DIAGNOSIS Your health care provider will take a medical history and perform a physical exam. To determine if you have mild or severe GERD, your health care provider may also monitor how you  respond to treatment. You may also have other tests, including:  An endoscopy toexamine your stomach and esophagus with a small camera.  A test thatmeasures the acidity level in your esophagus.  A test thatmeasures how much pressure is on your esophagus.  A barium swallow or modified barium swallow to show the shape, size, and functioning of your esophagus. TREATMENT The goal of treatment is to help relieve your symptoms and to prevent complications. Treatment for this condition may vary depending on how severe your symptoms are. Your health care provider may recommend:  Changes to your diet.  Medicine.  Surgery. HOME CARE INSTRUCTIONS Diet  Follow a diet as recommended by your health care provider. This may involve avoiding foods and drinks such as:  Coffee and tea (with or without caffeine).  Drinks that containalcohol.  Energy drinks and sports drinks.  Carbonated drinks or sodas.  Chocolate and cocoa.  Peppermint and mint flavorings.  Garlic and onions.  Horseradish.  Spicy and acidic foods, including peppers, chili powder, curry powder, vinegar, hot sauces, and barbecue sauce.  Citrus fruit juices and citrus fruits, such as oranges, lemons, and  limes.  Tomato-based foods, such as red sauce, chili, salsa, and pizza with red sauce.  Fried and fatty foods, such as donuts, french fries, potato chips, and high-fat dressings.  High-fat meats, such as hot dogs and fatty cuts of red and white meats, such as rib eye steak, sausage, ham, and bacon.  High-fat dairy items, such as whole milk, butter, and cream cheese.  Eat small, frequent meals instead of large meals.  Avoid drinking large amounts of liquid with your meals.  Avoid eating meals during the 2-3 hours before bedtime.  Avoid lying down right after you eat.  Do not exercise right after you eat. General Instructions  Pay attention to any changes in your symptoms.  Take over-the-counter and  prescription medicines only as told by your health care provider. Do not take aspirin, ibuprofen, or other NSAIDs unless your health care provider told you to do so.  Do not use any tobacco products, including cigarettes, chewing tobacco, and e-cigarettes. If you need help quitting, ask your health care provider.  Wear loose-fitting clothing. Do not wear anything tight around your waist that causes pressure on your abdomen.  Raise (elevate) the head of your bed 6 inches (15cm).  Try to reduce your stress, such as with yoga or meditation. If you need help reducing stress, ask your health care provider.  If you are overweight, reduce your weight to an amount that is healthy for you. Ask your health care provider for guidance about a safe weight loss goal.  Keep all follow-up visits as told by your health care provider. This is important. SEEK MEDICAL CARE IF:  You have new symptoms.  You have unexplained weight loss.  You have difficulty swallowing, or it hurts to swallow.  You have wheezing or a persistent cough.  Your symptoms do not improve with treatment.  You have a hoarse voice. SEEK IMMEDIATE MEDICAL CARE IF:  You have pain in your arms, neck, jaw, teeth, or back.  You feel sweaty, dizzy, or light-headed.  You have chest pain or shortness of breath.  You vomit and your vomit looks like blood or coffee grounds.  You faint.  Your stool is bloody or black.  You cannot swallow, drink, or eat.   This information is not intended to replace advice given to you by your health care provider. Make sure you discuss any questions you have with your health care provider.   Document Released: 03/05/2005 Document Revised: 02/14/2015 Document Reviewed: 09/20/2014 Elsevier Interactive Patient Education 2016 [employer].   Food Choices for Gastroesophageal Reflux Disease, Adult When you have gastroesophageal reflux disease (GERD), the foods you eat and your eating habits are  very important. Choosing the right foods can help ease the discomfort of GERD. WHAT GENERAL GUIDELINES DO I NEED TO FOLLOW?  Choose fruits, vegetables, whole grains, low-fat dairy products, and low-fat meat, fish, and poultry.  Limit fats such as oils, salad dressings, butter, nuts, and avocado.  Keep a food diary to identify foods that cause symptoms.  Avoid foods that cause reflux. These may be different for different people.  Eat frequent small meals instead of three large meals each day.  Eat your meals slowly, in a relaxed setting.  Limit fried foods.  Cook foods using methods other than frying.  Avoid drinking alcohol.  Avoid drinking large amounts of liquids with your meals.  Avoid bending over or lying down until 2-3 hours after eating. WHAT FOODS ARE NOT RECOMMENDED? The following are some foods and  drinks that may worsen your symptoms: Vegetables Tomatoes. Tomato juice. Tomato and spaghetti sauce. Chili peppers. Onion and garlic. Horseradish. Fruits Oranges, grapefruit, and lemon (fruit and juice). Meats High-fat meats, fish, and poultry. This includes hot dogs, ribs, ham, sausage, salami, and bacon. Dairy Whole milk and chocolate milk. Sour cream. Cream. Butter. Ice cream. Cream cheese.  Beverages Coffee and tea, with or without caffeine. Carbonated beverages or energy drinks. Condiments Hot sauce. Barbecue sauce.  Sweets/Desserts Chocolate and cocoa. Donuts. Peppermint and spearmint. Fats and Oils High-fat foods, including [place] fries and potato chips. Other Vinegar. Strong spices, such as black pepper, white pepper, red pepper, cayenne, curry powder, cloves, ginger, and chili powder. The items listed above may not be a complete list of foods and beverages to avoid. Contact your dietitian for more information.   This information is not intended to replace advice given to you by your health care provider. Make sure you discuss any questions you have with your  health care provider.   Document Released: 05/26/2005 Document Revised: 06/16/2014 Document Reviewed: 03/30/2013 Elsevier Interactive Patient Education [employer]

## 2015-10-29 NOTE — ED Provider Notes (Signed)
CSN: [id number]     Arrival date & time 10/29/15  1710 History  By signing my name below, I, Iona BeardChristian Pulliam, attest that this documentation has been prepared under the direction and in the presence of No att. providers found.   Electronically Signed: Iona Beardhristian Pulliam, ED Scribe. 10/31/2015. 7:20 AM   Chief Complaint  Patient presents with  . Shortness of Breath   The history is provided by the patient. No language interpreter was used.   HPI Comments: Brian Edwards is a 65 y.o. male with PMHx of GERD and asthma who presents to the Emergency Department complaining of gradual onset, intermittent, shortness of breath, ongoing for a few days. Pt notes associated wheezing and environmental allergies. Pt also complains of reflux symptoms and "heart burn". Pt states that he gets these symptoms with associated shortness of breath "a couple times a year". No other associated symptoms noted. Pt reports GERD makes his asthma worse. No other worsening or alleviating factors noted. Pt denies fever, chills, or any other pertinent symptoms.   Past Medical History  Diagnosis Date  . Asthma   . GERD (gastroesophageal reflux disease)    History reviewed. No pertinent past surgical history. Family History  Problem Relation Age of Onset  . Sudden death Neg Hx   . Hypertension Neg Hx   . Hyperlipidemia Neg Hx   . Heart attack Neg Hx   . Diabetes Neg Hx    Social History  Substance Use Topics  . Smoking status: Never Smoker   . Smokeless tobacco: Never Used  . Alcohol Use: No    Review of Systems  Constitutional: Negative for fever and chills.  Respiratory: Positive for shortness of breath and wheezing.   Gastrointestinal:       "Heart burn"   Allergic/Immunologic: Positive for environmental allergies.  All other systems reviewed and are negative.   Allergies  Review of patient's allergies indicates no known allergies.  Home Medications   Prior to Admission medications   Medication  Sig Start Date End Date Taking? Authorizing Provider  albuterol (PROVENTIL) (2.5 MG/3ML) 0.083% nebulizer solution Take 3 mLs (2.5 mg total) by nebulization every 6 (six) hours as needed for wheezing. 10/29/15 10/28/16  Leta BaptistEmily Roe Nguyen, MD  esomeprazole (NEXIUM) 40 MG capsule Take 40 mg by mouth daily before breakfast.      Historical Provider, MD  Fluticasone-Salmeterol (ADVAIR) 100-50 MCG/DOSE AEPB Inhale 1 puff into the lungs every 12 (twelve) hours. 10/29/15   Leta BaptistEmily Roe Nguyen, MD  HYDROcodone-acetaminophen (NORCO/VICODIN) 5-325 MG per tablet Take 1 tablet by mouth every 6 (six) hours as needed for pain. 12/13/12   Lenda KelpShane R Hudnall, MD  Ipratropium-Albuterol (COMBIVENT IN) Inhale into the lungs.      Historical Provider, MD  meloxicam (MOBIC) 15 MG tablet Take 1 tablet (15 mg total) by mouth daily. With food.  Start day AFTER finishing prednisone. 12/13/12   Lenda KelpShane R Hudnall, MD  pantoprazole (PROTONIX) 20 MG tablet Take 2 tablets (40 mg total) by mouth daily. 10/29/15 10/28/16  Leta BaptistEmily Roe Nguyen, MD  predniSONE (DELTASONE) 20 MG tablet Take 2 tablets (40 mg total) by mouth daily. 10/29/15   Leta BaptistEmily Roe Nguyen, MD   BP 125/83 mmHg  Pulse 80  Temp(Src) 98.8 F (37.1 C) (Oral)  Resp 24  Ht 5\' 5"  (1.651 m)  Wt 146 lb (66.225 kg)  BMI 24.30 kg/m2  SpO2 99% Physical Exam  Constitutional: He is oriented to person, place, and time. He appears well-developed and well-nourished. He  appears distressed (mild respiratory distress).  HENT:  Head: Normocephalic and atraumatic.  Right Ear: External ear normal.  Left Ear: External ear normal.  Mouth/Throat: Oropharynx is clear and moist. No oropharyngeal exudate.  Eyes: Conjunctivae and EOM are normal. Pupils are equal, round, and reactive to light.  Neck: Normal range of motion. Neck supple. No tracheal deviation present.  Cardiovascular: Normal rate, regular rhythm, normal heart sounds and intact distal pulses.  Exam reveals no gallop.   No murmur  heard. Pulmonary/Chest: He is in respiratory distress (mild). He has wheezes. He has no rales. He exhibits no tenderness.  Diffuse wheezing with accessory muscle use.   Abdominal: Soft. He exhibits no distension. There is no tenderness.  Musculoskeletal: Normal range of motion. He exhibits no edema.  Neurological: He is alert and oriented to person, place, and time.  Skin: Skin is warm and dry. No rash noted. He is not diaphoretic.  Psychiatric: He has a normal mood and affect. His behavior is normal.  Vitals reviewed.   ED Course  Procedures (including critical care time)  CRITICAL CARE Performed by: Larna Daughters   Total critical care time: 35 minutes  Critical care time was exclusive of separately billable procedures and treating other patients.  Critical care was necessary to treat or prevent imminent or life-threatening deterioration.  Critical care was time spent personally by me on the following activities: development of treatment plan with patient and/or surrogate as well as nursing, discussions with consultants, evaluation of patient's response to treatment, examination of patient, obtaining history from patient or surrogate, ordering and performing treatments and interventions, ordering and review of laboratory studies, ordering and review of radiographic studies, pulse oximetry and re-evaluation of patient's condition.  DIAGNOSTIC STUDIES: Oxygen Saturation is 99% on RA, normal by my interpretation.    COORDINATION OF CARE: 6:04 PM Discussed treatment plan with pt at bedside and pt agreed to plan.  Labs Review Labs Reviewed  BASIC METABOLIC PANEL - Abnormal; Notable for the following:    Glucose, Bld 103 (*)    All other components within normal limits  CBC    Imaging Review Dg Chest 2 View  10/29/2015  CLINICAL DATA:  Shortness of breath. EXAM: CHEST  2 VIEW COMPARISON:  December 09, 2012. FINDINGS: The heart size and mediastinal contours are within normal limits.  Both lungs are clear. No pneumothorax or pleural effusion is noted. The visualized skeletal structures are unremarkable. IMPRESSION: No active cardiopulmonary disease. Electronically Signed   By: Lupita Raider, M.D.   On: 10/29/2015 18:30   I have personally reviewed and evaluated these images and lab results as part of my medical decision-making.   EKG Interpretation None      MDM  Patient was seen and evaluated at bedside.  Patient in mild respiratory distress at time of evaluation, tripoding.  Patient given continuous 1 hour nebs x2 and solumedrol with great improvement although continued to have diffuse wheezing.  Chest xray and labs unremarkable.  Patient was also given GI cocktail and protonix with great improvement.  Discussed with patient my clinical concerns and potential benefit of admission for continued treatment.  He did not want to be admitted and reported feeling significantly better and almost to his baseline.  He asked for refills for his medications as he is trying to establish with a primary care physician.  He was provided with prescriptions for his advair, protonix, albuterol, and prednisone.  He was discharged in stable condition with strict return precautions.  Patient was ambulated on pulse ox before discharge and did not drop below 90% and also had improvement in pulse ox with walking. Final diagnoses:  Asthma exacerbation   I personally performed the services described in this documentation, which was scribed in my presence. The recorded information has been reviewed and is accurate.     Leta Baptist, MD 10/31/15 [phone]

## 2015-10-29 NOTE — ED Notes (Addendum)
Patient transported to X-ray 

## 2015-10-29 NOTE — ED Notes (Signed)
Reflux per family. His asthma is triggered by his GERD.

## 2015-10-29 NOTE — Progress Notes (Signed)
Patient was ambulated around the department twice.  Patient's initial SPO2 was 90% but then increased to between 92% and 93% as he began walking.

## 2017-03-26 ENCOUNTER — Emergency Department (HOSPITAL_BASED_OUTPATIENT_CLINIC_OR_DEPARTMENT_OTHER)
Admission: EM | Admit: 2017-03-26 | Discharge: 2017-03-26 | Disposition: A | Discharge status: Home / Self Care | Payer: Medicare Other | Attending: Emergency Medicine | Admitting: Emergency Medicine

## 2017-03-26 ENCOUNTER — Encounter (HOSPITAL_BASED_OUTPATIENT_CLINIC_OR_DEPARTMENT_OTHER): Payer: Self-pay | Admitting: *Deleted

## 2017-03-26 ENCOUNTER — Emergency Department (HOSPITAL_BASED_OUTPATIENT_CLINIC_OR_DEPARTMENT_OTHER): Payer: Medicare Other

## 2017-03-26 DIAGNOSIS — Z79899 Other long term (current) drug therapy: Secondary | ICD-10-CM | POA: Diagnosis not present

## 2017-03-26 DIAGNOSIS — Y998 Other external cause status: Secondary | ICD-10-CM | POA: Insufficient documentation

## 2017-03-26 DIAGNOSIS — Y9389 Activity, other specified: Secondary | ICD-10-CM | POA: Insufficient documentation

## 2017-03-26 DIAGNOSIS — S9032XA Contusion of left foot, initial encounter: Secondary | ICD-10-CM | POA: Diagnosis not present

## 2017-03-26 DIAGNOSIS — J45909 Unspecified asthma, uncomplicated: Secondary | ICD-10-CM | POA: Insufficient documentation

## 2017-03-26 DIAGNOSIS — W208XXA Other cause of strike by thrown, projected or falling object, initial encounter: Secondary | ICD-10-CM | POA: Diagnosis not present

## 2017-03-26 DIAGNOSIS — S99922A Unspecified injury of left foot, initial encounter: Secondary | ICD-10-CM | POA: Diagnosis present

## 2017-03-26 DIAGNOSIS — Y929 Unspecified place or not applicable: Secondary | ICD-10-CM | POA: Diagnosis not present

## 2017-03-26 MED ORDER — PANTOPRAZOLE SODIUM 20 MG PO TBEC
40.0000 mg | DELAYED_RELEASE_TABLET | Freq: Every day | ORAL | 1 refills | Status: AC
Start: 2017-03-26 — End: 2021-10-30

## 2017-03-26 MED FILL — PANTOPRAZOLE SOD DR 40 MG T: 40 | 30 days supply | Qty: 30 | Fill #0

## 2017-03-26 NOTE — ED Notes (Signed)
Pt verbalizes understanding of d/c instructions and denies any further needs at this time. 

## 2017-03-26 NOTE — ED Provider Notes (Signed)
[facility] EMERGENCY DEPARTMENT Provider Note   CSN: [id number] Arrival date & time: 03/26/17  1207     History   Chief Complaint Chief Complaint  Patient presents with  . Foot Injury    HPI Brian Edwards is a 66 y.o. male.  HPI  66 year old male who presents to the emergency department with left foot pain that started 2 days ago after fluorescent light casing fell onto his foot. Pain is exacerbated with range of motion walking, palpation of the third and fourth toes. Alleviated by Tylenol and Motrin. Denies any other injuries or physical complaints.   Past Medical History:  Diagnosis Date  . Asthma   . GERD (gastroesophageal reflux disease)     Patient Active Problem List   Diagnosis Date Noted  . Low back pain radiating to right leg 12/14/2012    History reviewed. No pertinent surgical history.     Home Medications    Prior to Admission medications   Medication Sig Start Date End Date Taking? Authorizing Provider  esomeprazole (NEXIUM) 40 MG capsule Take 40 mg by mouth daily before breakfast.     Yes [provider]  meloxicam (MOBIC) 15 MG tablet Take 1 tablet (15 mg total) by mouth daily. With food.  Start day AFTER finishing prednisone. 12/13/12  Yes Hudnall, Azucena Fallen, MD  predniSONE (DELTASONE) 20 MG tablet Take 2 tablets (40 mg total) by mouth daily. 10/29/15  Yes Leta Baptist, MD  albuterol (PROVENTIL) (2.5 MG/3ML) 0.083% nebulizer solution Take 3 mLs (2.5 mg total) by nebulization every 6 (six) hours as needed for wheezing. 10/29/15 10/28/16  Leta Baptist, MD  Fluticasone-Salmeterol (ADVAIR) 100-50 MCG/DOSE AEPB Inhale 1 puff into the lungs every 12 (twelve) hours. 10/29/15   Leta Baptist, MD  HYDROcodone-acetaminophen (NORCO/VICODIN) 5-325 MG per tablet Take 1 tablet by mouth every 6 (six) hours as needed for pain. 12/13/12   Hudnall, Azucena Fallen, MD  Ipratropium-Albuterol (COMBIVENT IN) Inhale into the lungs.      [provider]  pantoprazole (PROTONIX) 20 MG tablet Take 2 tablets (40 mg total) by mouth daily. 03/26/17 08/22/18  Nira Conn, MD    Family History Family History  Problem Relation Age of Onset  . Sudden death Neg Hx   . Hypertension Neg Hx   . Hyperlipidemia Neg Hx   . Heart attack Neg Hx   . Diabetes Neg Hx     Social History Social History  Substance Use Topics  . Smoking status: Never Smoker  . Smokeless tobacco: Never Used  . Alcohol use No     Allergies   Patient has no known allergies.   Review of Systems Review of Systems All other systems are reviewed and are negative for acute change except as noted in the HPI   Physical Exam Updated Vital Signs BP 122/84   Pulse 68   Temp 98.2 F (36.8 C) (Oral)   Resp 20   Ht 5\' 5"  (1.651 m)   Wt 66.2 kg (146 lb)   SpO2 98%   BMI 24.30 kg/m   Physical Exam  Constitutional: He is oriented to person, place, and time. He appears well-developed and well-nourished. No distress.  HENT:  Head: Normocephalic and atraumatic.  Right Ear: External ear normal.  Left Ear: External ear normal.  Nose: Nose normal.  Mouth/Throat: Mucous membranes are normal. No trismus in the jaw.  Eyes: Conjunctivae and EOM are normal. No scleral icterus.  Neck: Normal range of motion  and phonation normal.  Cardiovascular: Normal rate and regular rhythm.   Pulmonary/Chest: Effort normal. No stridor. No respiratory distress.  Abdominal: He exhibits no distension.  Musculoskeletal: Normal range of motion. He exhibits no edema.       Left foot: There is tenderness. There is no swelling and no deformity.       Feet:  Neurological: He is alert and oriented to person, place, and time.  Skin: He is not diaphoretic.  Psychiatric: He has a normal mood and affect. His behavior is normal.  Vitals reviewed.    ED Treatments / Results  Labs (all labs ordered are listed, but only abnormal results are displayed) Labs Reviewed - No  data to display  EKG  EKG Interpretation None       Radiology Dg Foot Complete Left  Result Date: 03/26/2017 CLINICAL DATA:  Injury 2 days ago. Pain left third and fourth digits. EXAM: LEFT FOOT - COMPLETE 3+ VIEW COMPARISON:  No recent . FINDINGS: There is no evidence of fracture or dislocation. There is no evidence of arthropathy or other focal bone abnormality. Soft tissues are unremarkable. IMPRESSION: No acute bony or joint abnormality. Electronically Signed   By: Maisie Fushomas  Register   On: 03/26/2017 12:51    Procedures Procedures (including critical care time)  Medications Ordered in ED Medications - No data to display   Initial Impression / Assessment and Plan / ED Course  I have reviewed the triage vital signs and the nursing notes.  Pertinent labs & imaging results that were available during my care of the patient were reviewed by me and considered in my medical decision making (see chart for details).     Plain film without evidence of fracture or dislocation. Likely contusion. Recommended symptomatic management with Tylenol/Motrin.  The patient is safe for discharge with strict return precautions.  Patient requesting a refill for his protonix.    Final Clinical Impressions(s) / ED Diagnoses   Final diagnoses:  Contusion of left foot, initial encounter   Disposition: Discharge  Condition: Good  I have discussed the results, Dx and Tx plan with the patient who expressed understanding and agree(s) with the plan. Discharge instructions discussed at great length. The patient was given strict return precautions who verbalized understanding of the instructions. No further questions at time of discharge.    Current Discharge Medication List      Follow Up: primary care provider         Nira Connardama, Pedro Eduardo, MD 03/26/17 1328

## 2017-03-26 NOTE — ED Notes (Signed)
Patient transported to X-ray 

## 2017-03-26 NOTE — ED Triage Notes (Signed)
He dropped a fluorescent light on from the ceiling onto his left foot 2 days ago. Pain in her 3rd and 4th toes. While he is here he would like to have a refill on his magic mouth wash.

## 2017-05-31 IMAGING — DX DG CHEST 2V
2 series · 2 of 2 positions shown · non-contrast
Comparison: December 09, 2012.

CLINICAL DATA: Shortness of breath.

EXAM:
CHEST  2 VIEW

[chest pa]
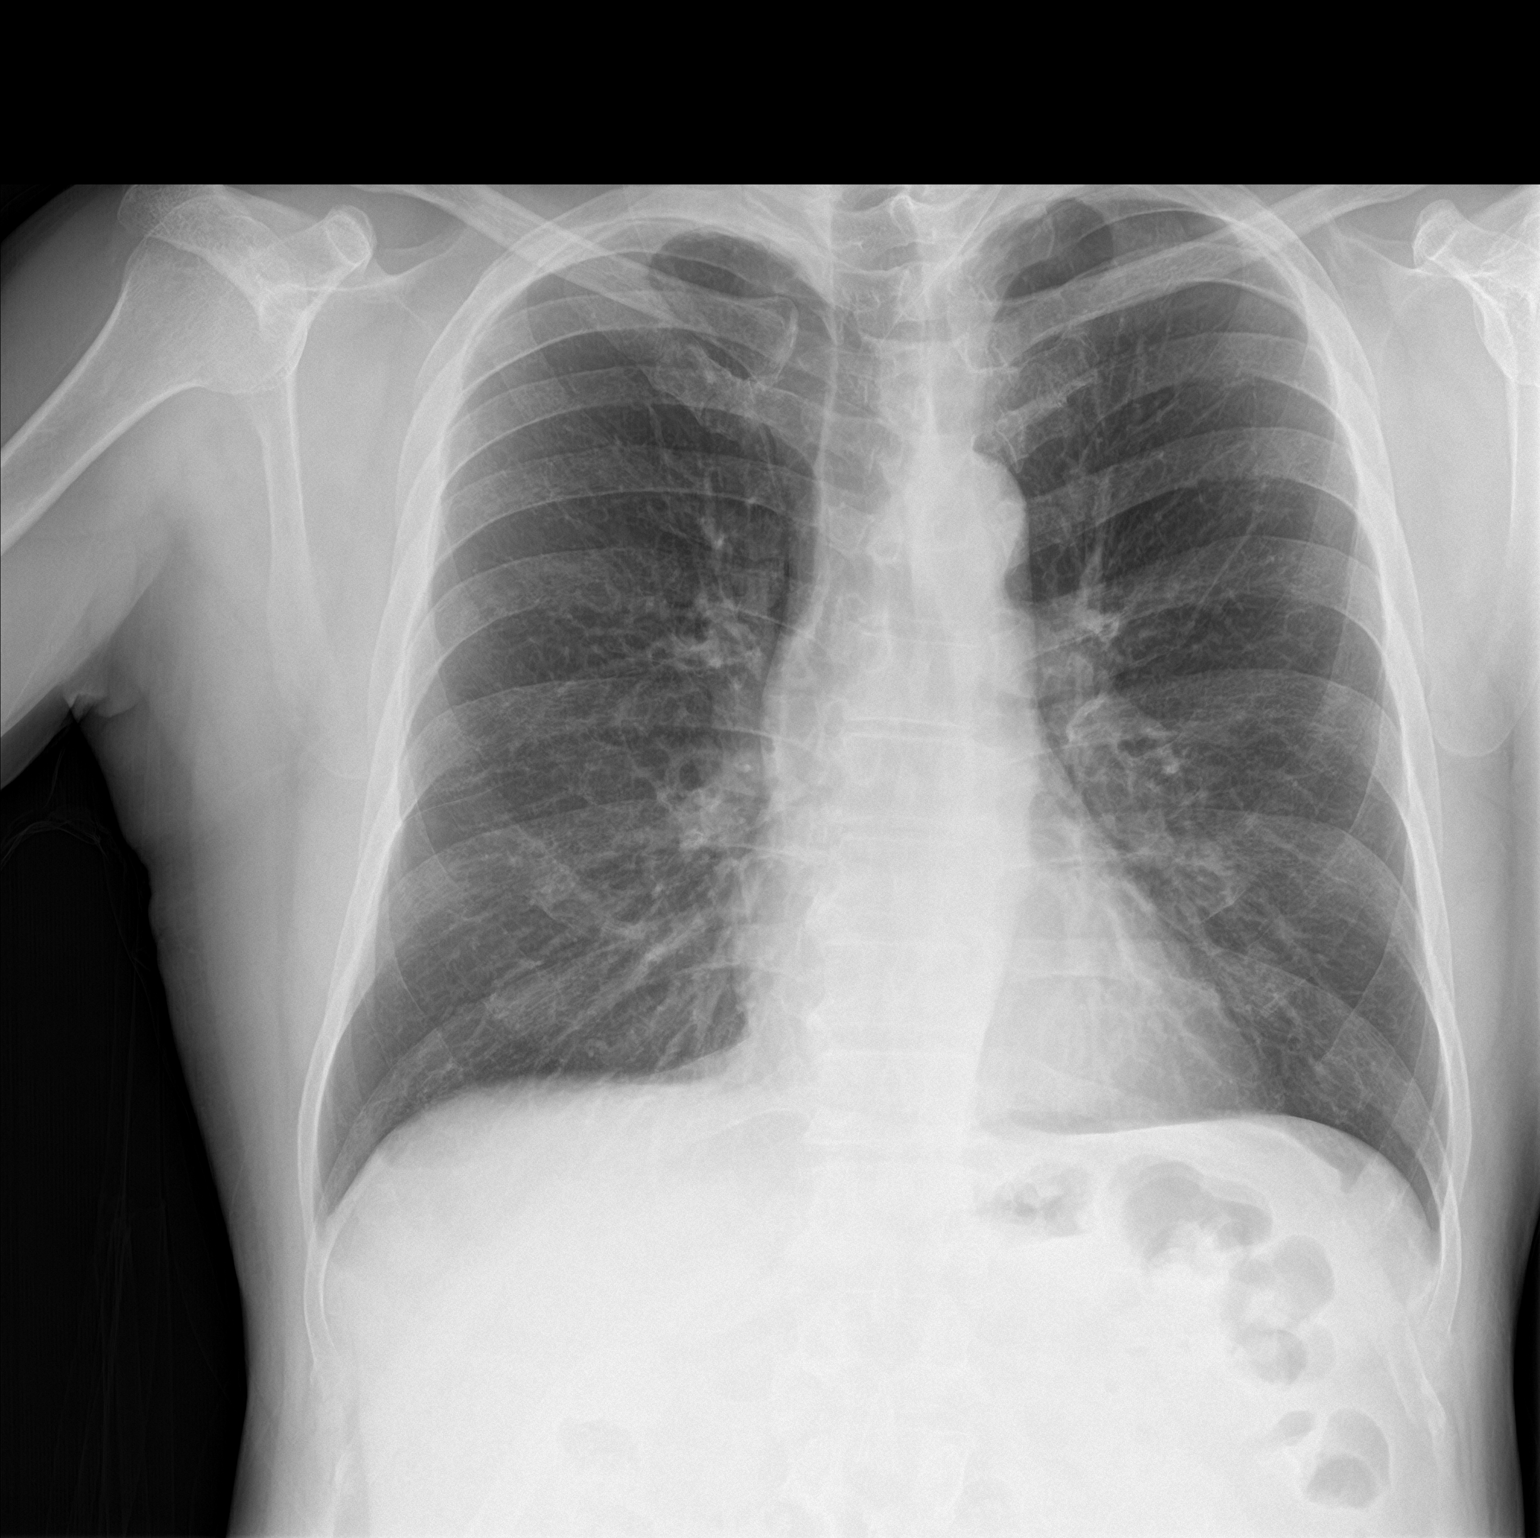

[chest lat]
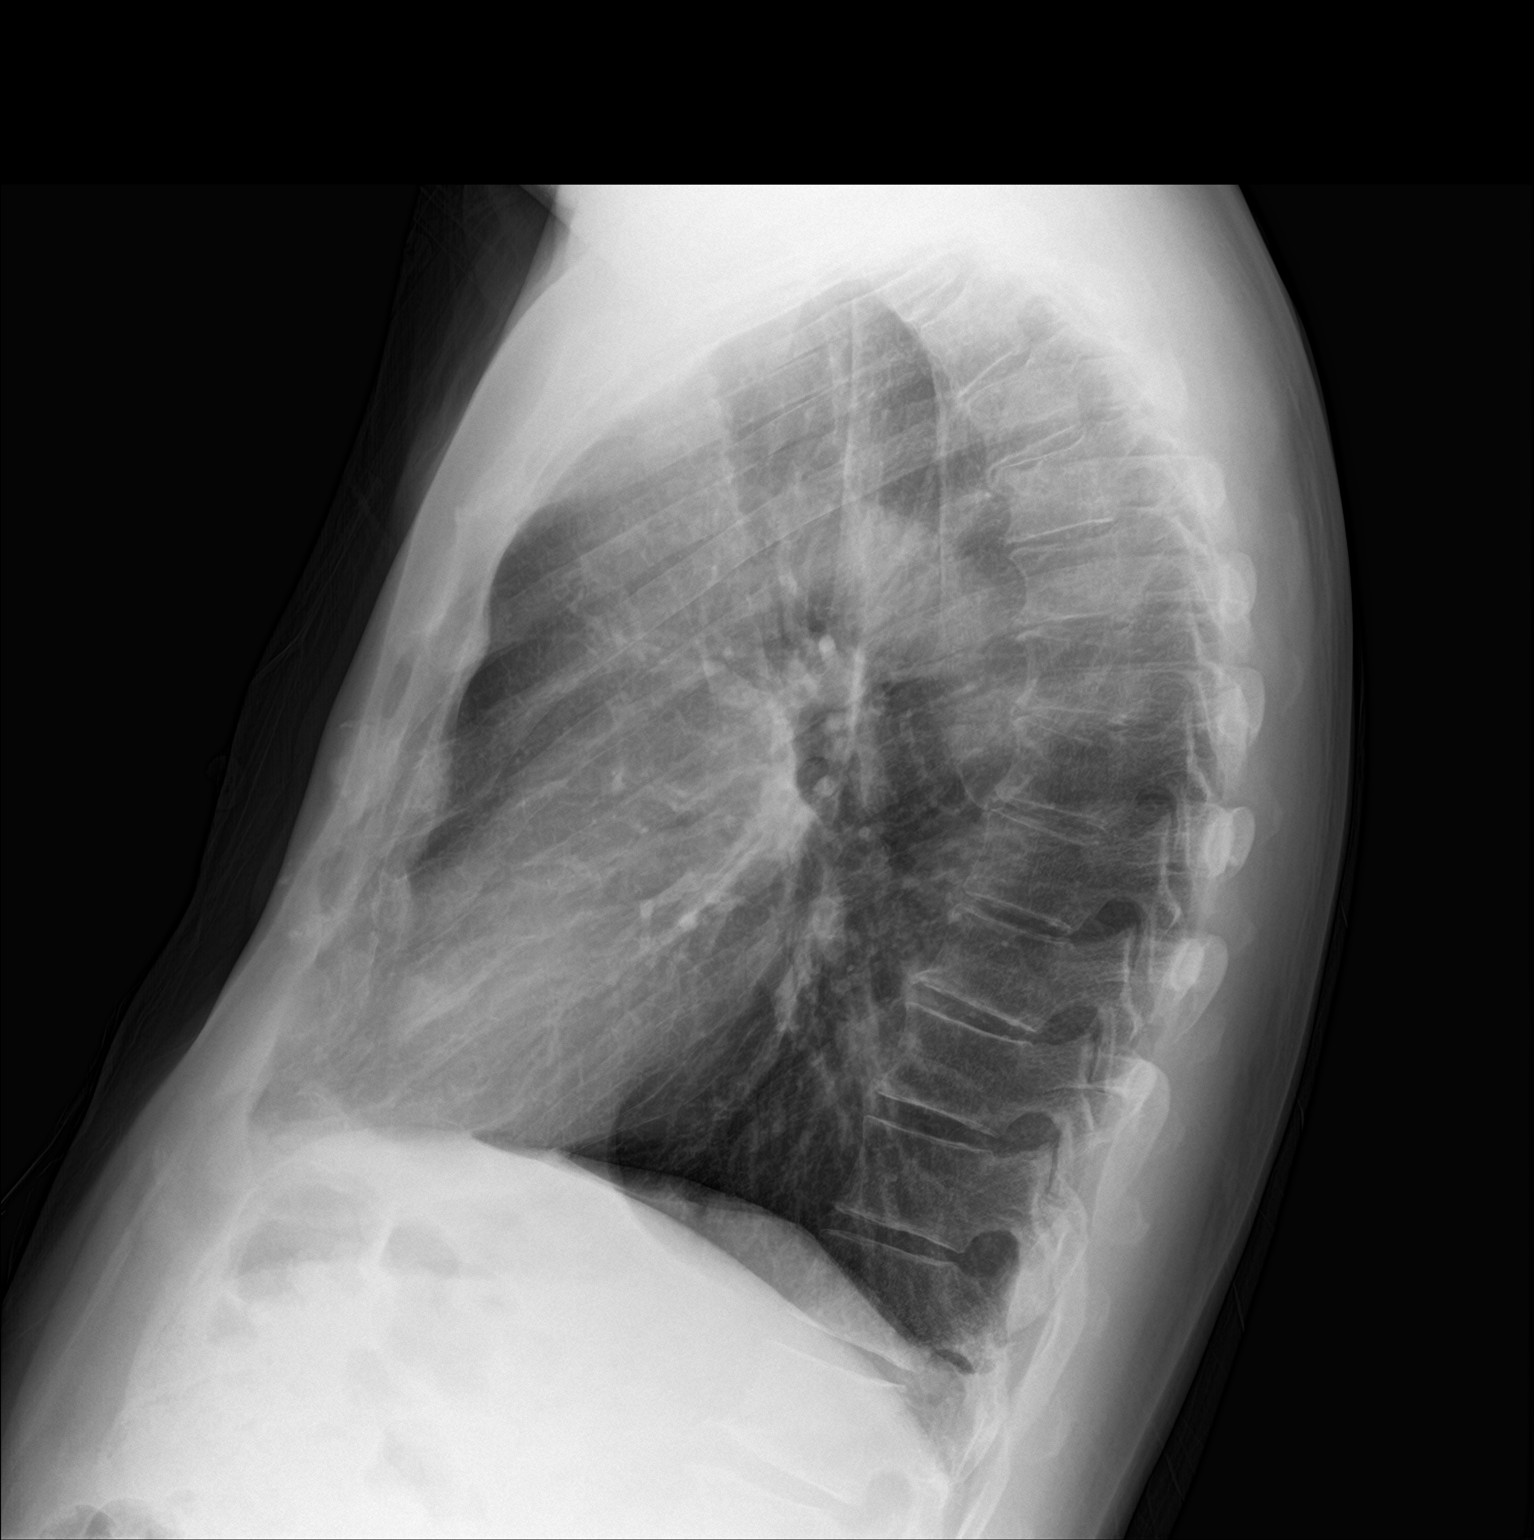

[2 of 2 positions shown; findings below may reference images not displayed]

FINDINGS: The heart size and mediastinal contours are within normal limits.
Both lungs are clear. No pneumothorax or pleural effusion is noted.
The visualized skeletal structures are unremarkable.
IMPRESSION: No active cardiopulmonary disease.

## 2017-08-31 ENCOUNTER — Emergency Department (HOSPITAL_BASED_OUTPATIENT_CLINIC_OR_DEPARTMENT_OTHER)
Admission: EM | Admit: 2017-08-31 | Discharge: 2017-08-31 | Disposition: A | Discharge status: Home / Self Care | Payer: Medicare Other | Attending: Emergency Medicine | Admitting: Emergency Medicine

## 2017-08-31 ENCOUNTER — Emergency Department (HOSPITAL_BASED_OUTPATIENT_CLINIC_OR_DEPARTMENT_OTHER): Payer: Medicare Other

## 2017-08-31 ENCOUNTER — Encounter (HOSPITAL_BASED_OUTPATIENT_CLINIC_OR_DEPARTMENT_OTHER): Payer: Self-pay

## 2017-08-31 DIAGNOSIS — R202 Paresthesia of skin: Secondary | ICD-10-CM | POA: Insufficient documentation

## 2017-08-31 DIAGNOSIS — J45909 Unspecified asthma, uncomplicated: Secondary | ICD-10-CM | POA: Insufficient documentation

## 2017-08-31 DIAGNOSIS — M545 Low back pain: Secondary | ICD-10-CM | POA: Diagnosis present

## 2017-08-31 DIAGNOSIS — M5442 Lumbago with sciatica, left side: Secondary | ICD-10-CM | POA: Insufficient documentation

## 2017-08-31 DIAGNOSIS — Z79899 Other long term (current) drug therapy: Secondary | ICD-10-CM | POA: Insufficient documentation

## 2017-08-31 DIAGNOSIS — M5441 Lumbago with sciatica, right side: Secondary | ICD-10-CM

## 2017-08-31 LAB — CBC WITH DIFFERENTIAL/PLATELET
BASOS ABS: 0 10*3/uL (ref 0.0–0.1)
Basophils Relative: 0 %
Eosinophils Absolute: 0.3 10*3/uL (ref 0.0–0.7)
Eosinophils Relative: 5 %
HCT: 45.2 % (ref 39.0–52.0)
HEMOGLOBIN: 15.4 g/dL (ref 13.0–17.0)
LYMPHS ABS: 1.9 10*3/uL (ref 0.7–4.0)
LYMPHS PCT: 34 %
MCH: 30.6 pg (ref 26.0–34.0)
MCHC: 34.1 g/dL (ref 30.0–36.0)
MCV: 89.7 fL (ref 78.0–100.0)
Monocytes Absolute: 0.5 10*3/uL (ref 0.1–1.0)
Monocytes Relative: 9 %
NEUTROS PCT: 52 %
Neutro Abs: 2.9 10*3/uL (ref 1.7–7.7)
Platelets: 203 10*3/uL (ref 150–400)
RBC: 5.04 MIL/uL (ref 4.22–5.81)
RDW: 12.4 % (ref 11.5–15.5)
WBC: 5.6 10*3/uL (ref 4.0–10.5)

## 2017-08-31 LAB — BASIC METABOLIC PANEL
ANION GAP: 6 (ref 5–15)
BUN: 17 mg/dL (ref 6–20)
CHLORIDE: 107 mmol/L (ref 101–111)
CO2: 25 mmol/L (ref 22–32)
Calcium: 9.2 mg/dL (ref 8.9–10.3)
Creatinine, Ser: 1.06 mg/dL (ref 0.61–1.24)
GFR calc Af Amer: >60 mL/min (ref >60)
GLUCOSE: 101 mg/dL — AB (ref 65–99)
POTASSIUM: 4.6 mmol/L (ref 3.5–5.1)
SODIUM: 138 mmol/L (ref 135–145)

## 2017-08-31 MED ORDER — NAPROXEN 500 MG PO TABS: 500.0000 mg | ORAL_TABLET | Freq: Two times a day (BID) | ORAL | 0 refills | Status: AC

## 2017-08-31 MED ORDER — LIDOCAINE 5 % EX PTCH: 1.0000 | MEDICATED_PATCH | CUTANEOUS | 0 refills | Status: DC

## 2017-08-31 MED FILL — NAPROXEN 500 MG TABLET: 500 | 15 days supply | Qty: 30 | Fill #0

## 2017-08-31 NOTE — ED Provider Notes (Signed)
[facility] EMERGENCY DEPARTMENT Provider Note   CSN: [id number] Arrival date & time: 08/31/17  1027     History   Chief Complaint Chief Complaint  Patient presents with  . Leg Pain    bilateral lower legs burning    HPI Brian Edwards is a 67 y.o. male with a history of asthma and GERD who presents the emergency department complaining of lower back pain radiating down bilateral lower extremities x 4 days.  Patient describes the pain as starting in the lower back/buttocks region radiating down the back of the legs.  States that feeling in the legs is somewhat of a tingling/burning sensation at times.  States that it is worse with being on his feet for extended periods of time.  Better with Motrin/Tylenol.  States he has a history of similar whenever he is done heavy lifting or been on his feet for extended periods of time.  Started new job recently where he is on his feet regularly. Denies numbness, weakness, incontinence to bowel/bladder, fever, chills, IV drug use, or hx of cancer.  HPI  Past Medical History:  Diagnosis Date  . Asthma   . GERD (gastroesophageal reflux disease)     Patient Active Problem List   Diagnosis Date Noted  . Low back pain radiating to right leg 12/14/2012    History reviewed. No pertinent surgical history.      Home Medications    Prior to Admission medications   Medication Sig Start Date End Date Taking? Authorizing Provider  albuterol (PROVENTIL) (2.5 MG/3ML) 0.083% nebulizer solution Take 3 mLs (2.5 mg total) by nebulization every 6 (six) hours as needed for wheezing. 10/29/15 10/28/16  Leta BaptistNguyen, Emily Roe, MD  esomeprazole (NEXIUM) 40 MG capsule Take 40 mg by mouth daily before breakfast.      [provider]  Fluticasone-Salmeterol (ADVAIR) 100-50 MCG/DOSE AEPB Inhale 1 puff into the lungs every 12 (twelve) hours. 10/29/15   Leta BaptistNguyen, Emily Roe, MD  HYDROcodone-acetaminophen (NORCO/VICODIN) 5-325 MG per tablet Take 1 tablet  by mouth every 6 (six) hours as needed for pain. 12/13/12   Hudnall, Azucena FallenShane R, MD  Ipratropium-Albuterol (COMBIVENT IN) Inhale into the lungs.      [provider]  meloxicam (MOBIC) 15 MG tablet Take 1 tablet (15 mg total) by mouth daily. With food.  Start day AFTER finishing prednisone. 12/13/12   Hudnall, Azucena FallenShane R, MD  pantoprazole (PROTONIX) 20 MG tablet Take 2 tablets (40 mg total) by mouth daily. 03/26/17 08/22/18  Nira Connardama, Pedro Eduardo, MD  predniSONE (DELTASONE) 20 MG tablet Take 2 tablets (40 mg total) by mouth daily. 10/29/15   Leta BaptistNguyen, Emily Roe, MD  dexlansoprazole (DEXILANT) 60 MG capsule Take 60 mg by mouth daily.  11/25/11  [provider]    Family History Family History  Problem Relation Age of Onset  . Sudden death Neg Hx   . Hypertension Neg Hx   . Hyperlipidemia Neg Hx   . Heart attack Neg Hx   . Diabetes Neg Hx     Social History Social History   Tobacco Use  . Smoking status: Never Smoker  . Smokeless tobacco: Never Used  Substance Use Topics  . Alcohol use: No  . Drug use: No     Allergies   Patient has no known allergies.   Review of Systems Review of Systems  Respiratory: Negative for shortness of breath.   Cardiovascular: Negative for chest pain and leg swelling.  Musculoskeletal: Positive for back pain and myalgias.  Neurological: Negative for weakness and numbness.       Positive for paresthesias LEs. Negative for incontinence to bowel/bladder or saddle anesthesia.     Physical Exam Updated Vital Signs BP 100/68 (BP Location: Left Arm)   Pulse (!) 58   Temp 97.9 F (36.6 C) (Oral)   Resp 20   SpO2 100%   Physical Exam  Constitutional: He appears well-developed and well-nourished. No distress.  HENT:  Head: Normocephalic and atraumatic.  Eyes: Conjunctivae are normal. Right eye exhibits no discharge. Left eye exhibits no discharge.  Cardiovascular: Normal rate and regular rhythm.  Pulses:      Posterior tibial pulses are  2+ on the right side, and 2+ on the left side.  Pulmonary/Chest: Effort normal and breath sounds normal. No respiratory distress.  Musculoskeletal:  No obvious deformities, appreciable swelling, erythema, or ecchymosis. Back: No midline tenderness. No paraspinal muscle tenderness to palpation.   Lower extremities: Normal range of motion.  No tenderness to palpation.  Neurological: He is alert.  Clear speech.  5 out of 5 strength plantar dorsiflexion bilaterally.  Sensation grossly intact bilateral lower extremities.  Patellar DTRs are 2+ and symmetric.  Gait is steady and intact.  Psychiatric: He has a normal mood and affect. His behavior is normal. Thought content normal.  Nursing note and vitals reviewed.   ED Treatments / Results  Labs Results for orders placed or performed during the hospital encounter of 08/31/17  CBC with Differential/Platelet  Result Value Ref Range   WBC 5.6 4.0 - 10.5 K/uL   RBC 5.04 4.22 - 5.81 MIL/uL   Hemoglobin 15.4 13.0 - 17.0 g/dL   HCT 40.9 81.1 - 91.4 %   MCV 89.7 78.0 - 100.0 fL   MCH 30.6 26.0 - 34.0 pg   MCHC 34.1 30.0 - 36.0 g/dL   RDW 78.2 95.6 - 21.3 %   Platelets 203 150 - 400 K/uL   Neutrophils Relative % 52 %   Neutro Abs 2.9 1.7 - 7.7 K/uL   Lymphocytes Relative 34 %   Lymphs Abs 1.9 0.7 - 4.0 K/uL   Monocytes Relative 9 %   Monocytes Absolute 0.5 0.1 - 1.0 K/uL   Eosinophils Relative 5 %   Eosinophils Absolute 0.3 0.0 - 0.7 K/uL   Basophils Relative 0 %   Basophils Absolute 0.0 0.0 - 0.1 K/uL  Basic metabolic panel  Result Value Ref Range   Sodium 138 135 - 145 mmol/L   Potassium 4.6 3.5 - 5.1 mmol/L   Chloride 107 101 - 111 mmol/L   CO2 25 22 - 32 mmol/L   Glucose, Bld 101 (H) 65 - 99 mg/dL   BUN 17 6 - 20 mg/dL   Creatinine, Ser 0.86 0.61 - 1.24 mg/dL   Calcium 9.2 8.9 - 57.8 mg/dL   GFR calc non Af Amer >60 >60 mL/min   GFR calc Af Amer >60 >60 mL/min   Anion gap 6 5 - 15   EKG None  Radiology Dg Lumbar Spine  Complete  Result Date: 08/31/2017 CLINICAL DATA:  Low back pain for a few days.  No known injury. EXAM: LUMBAR SPINE - COMPLETE 4+ VIEW COMPARISON:  Lumbar MRI 12/21/2012. Lumbar spine radiographs 12/09/2012 and chest radiographs 10/29/2015. FINDINGS: In correlation with prior studies and as noted previous MRI, there is transitional lumbosacral anatomy with a transitional, partially lumbarized S1 segment. There is a right-sided assimilation joint at S1-2. The alignment is normal. There is stable mild disc space narrowing  at L5-S1 and mild facet hypertrophy. No evidence of acute fracture or pars defect. IMPRESSION: Stable mild lumbar spondylosis with normal alignment. No acute osseous findings. Electronically Signed   By: Carey Bullocks M.D.   On: 08/31/2017 12:17    Procedures Procedures (including critical care time)  Medications Ordered in ED Medications - No data to display   Initial Impression / Assessment and Plan / ED Course  I have reviewed the triage vital signs and the nursing notes.  Pertinent labs & imaging results that were available during my care of the patient were reviewed by me and considered in my medical decision making (see chart for details).    Patient presents with complaint of back/buttocks pain radiating to BLE.  Patient is nontoxic appearing, vitals are without significant abnormality.. Patient has no neurological deficits or point vertebral tenderness. Lumbar X-ray negative for acute abnormality- no fx or dislocation.  Patient is able to ambulate. No loss of bowel or bladder control. No saddle anesthesia.  No concern for cauda equina.  No fever, night sweats, weight loss, h/o cancer, or IVDU.  Lab work grossly unremarkable- no anemia or electrolyte abnormality to cause paresthesias. Presentation consistent with sciatica. Will treat with Naproxen and Lidoderm patches.  I discussed treatment plan, need for PCP follow-up, and return precautions with the patient. Provided  opportunity for questions, patient confirmed understanding and is in agreement with plan.   Findings and plan of care discussed with supervising physician Dr. Nicanor Alcon who personally evaluated and examined this patient and is in agreement.   Final Clinical Impressions(s) / ED Diagnoses   Final diagnoses:  Bilateral low back pain with bilateral sciatica, unspecified chronicity    ED Discharge Orders        Ordered    naproxen (NAPROSYN) 500 MG tablet  2 times daily     08/31/17 1228    lidocaine (LIDODERM) 5 %  Every 24 hours     08/31/17 1228       Cherly Anderson, PA-C 08/31/17 1243    Palumbo, April, MD 08/31/17 1402

## 2017-08-31 NOTE — ED Triage Notes (Signed)
Pt c/o pain to bilateral lower legs with burning sensation; states stands up all day at work.

## 2017-08-31 NOTE — Discharge Instructions (Signed)
You were seen in the emergency department today for pain in your back going into your legs.  Your x-ray did not show any fractures or dislocations, it did show some degenerative changes which is normal with aging.  Your lab work did not show any problems with your electrolytes, kidney function, or hemoglobin.  No signs of anemia or infection.  We suspect your pain is related to sciatica.  I have prescribed you an anti-inflammatory medication and patches to help with the discomfort.   Naproxen is a nonsteroidal anti-inflammatory medication that will help with pain and swelling. Be sure to take this medication as prescribed with food, 1 pill every 12 hours,  It should be taken with food, as it can cause stomach upset, and more seriously, stomach bleeding. Do not take other nonsteroidal anti-inflammatory medications with this such as Advil, Motrin, or Aleve.   Lidoderm patches-place 1 patch on each buttock region daily.  If these are overly expensive please ask your pharmacist about over-the-counter versions of these patches.  In addition you may also take Tylenol. Tylenol is generally safe, though you should not take more than 8 of the extra strength (500mg ) pills a day.  Your pain should get better over the next 2 weeks.  You will need to follow up with  Your primary healthcare provider in 1-2 weeks for reassessment, if you do not have a primary care provider one is provided in your discharge instructions. However if you develop severe or worsening pain, low back pain with fever, numbness, weakness, loss of bowel or bladder control, or inability to walk or urinate, you should return to the ER immediately.  Please follow up with your doctor this week for a recheck if still having symptoms.

## 2017-11-19 ENCOUNTER — Emergency Department (HOSPITAL_BASED_OUTPATIENT_CLINIC_OR_DEPARTMENT_OTHER)
Admission: EM | Admit: 2017-11-19 | Discharge: 2017-11-19 | Disposition: A | Discharge status: Home / Self Care | Payer: Medicare Other | Attending: Emergency Medicine | Admitting: Emergency Medicine

## 2017-11-19 ENCOUNTER — Other Ambulatory Visit: Payer: Self-pay

## 2017-11-19 ENCOUNTER — Encounter (HOSPITAL_BASED_OUTPATIENT_CLINIC_OR_DEPARTMENT_OTHER): Payer: Self-pay | Admitting: *Deleted

## 2017-11-19 DIAGNOSIS — M62830 Muscle spasm of back: Secondary | ICD-10-CM

## 2017-11-19 DIAGNOSIS — Z79899 Other long term (current) drug therapy: Secondary | ICD-10-CM | POA: Insufficient documentation

## 2017-11-19 DIAGNOSIS — J45909 Unspecified asthma, uncomplicated: Secondary | ICD-10-CM | POA: Diagnosis not present

## 2017-11-19 DIAGNOSIS — M545 Low back pain: Secondary | ICD-10-CM | POA: Diagnosis present

## 2017-11-19 MED ORDER — LIDOCAINE 5 % EX PTCH: 1.0000 | MEDICATED_PATCH | CUTANEOUS | 0 refills | Status: DC

## 2017-11-19 NOTE — Discharge Instructions (Signed)
You have a muscle strain of your back. Please try the patch, if no improvement you can also use heating pad or ice. Occasional ibuprofen is safe, and tylenol is also good.

## 2017-11-19 NOTE — ED Provider Notes (Signed)
[facility] EMERGENCY DEPARTMENT Provider Note   CSN: [id number] Arrival date & time: 11/19/17  1256     History   Chief Complaint Chief Complaint  Patient presents with  . Back Pain    HPI Brian Edwards is a 67 y.o. male.  HPI  Patient presents with 1 day of lumbar muscular back pain.  He states he has had this before and lidocaine patches helped the pain significantly.  He works at a [employer] and does not do any repetitive heavy lifting, but yesterday a coworker asked him to help push a heavy box full of chicken and he felt his back strain at that time.  At home, he tried ibuprofen and Tylenol and 1 old pain medicine from a dental procedure which improved the pain about 50%.  He has not had any weight loss, incontinence, weakness.  Past Medical History:  Diagnosis Date  . Asthma   . GERD (gastroesophageal reflux disease)     Patient Active Problem List   Diagnosis Date Noted  . Low back pain radiating to right leg 12/14/2012    History reviewed. No pertinent surgical history.      Home Medications    Prior to Admission medications   Medication Sig Start Date End Date Taking? Authorizing Provider  albuterol (PROVENTIL) (2.5 MG/3ML) 0.083% nebulizer solution Take 3 mLs (2.5 mg total) by nebulization every 6 (six) hours as needed for wheezing. 10/29/15 10/28/16  Leta BaptistNguyen, Emily Roe, MD  esomeprazole (NEXIUM) 40 MG capsule Take 40 mg by mouth daily before breakfast.      [provider]  Fluticasone-Salmeterol (ADVAIR) 100-50 MCG/DOSE AEPB Inhale 1 puff into the lungs every 12 (twelve) hours. 10/29/15   Leta BaptistNguyen, Emily Roe, MD  HYDROcodone-acetaminophen (NORCO/VICODIN) 5-325 MG per tablet Take 1 tablet by mouth every 6 (six) hours as needed for pain. 12/13/12   Hudnall, Azucena FallenShane R, MD  Ipratropium-Albuterol (COMBIVENT IN) Inhale into the lungs.      [provider]  lidocaine (LIDODERM) 5 % Place 1 patch onto the skin daily. Remove & Discard  patch within 12 hours or as directed by MD 11/19/17   Garth Bignessimberlake, Kathryn, MD  meloxicam (MOBIC) 15 MG tablet Take 1 tablet (15 mg total) by mouth daily. With food.  Start day AFTER finishing prednisone. 12/13/12   Hudnall, Azucena FallenShane R, MD  naproxen (NAPROSYN) 500 MG tablet Take 1 tablet (500 mg total) by mouth 2 (two) times daily. 08/31/17   Petrucelli, Samantha R, PA-C  pantoprazole (PROTONIX) 20 MG tablet Take 2 tablets (40 mg total) by mouth daily. 03/26/17 08/22/18  Nira Connardama, Pedro Eduardo, MD  predniSONE (DELTASONE) 20 MG tablet Take 2 tablets (40 mg total) by mouth daily. 10/29/15   Leta BaptistNguyen, Emily Roe, MD    Family History Family History  Problem Relation Age of Onset  . Sudden death Neg Hx   . Hypertension Neg Hx   . Hyperlipidemia Neg Hx   . Heart attack Neg Hx   . Diabetes Neg Hx     Social History Social History   Tobacco Use  . Smoking status: Never Smoker  . Smokeless tobacco: Never Used  Substance Use Topics  . Alcohol use: No  . Drug use: No     Allergies   Patient has no known allergies.   Review of Systems Review of Systems  Constitutional: Negative for activity change, appetite change and fever.  Gastrointestinal: Negative for abdominal pain.  Genitourinary: Negative for dysuria.  Musculoskeletal: Positive for back pain.  Negative for gait problem.  Skin: Negative for rash and wound.  Neurological: Negative for weakness and numbness.     Physical Exam Updated Vital Signs BP 119/69   Pulse 78   Temp 97.7 F (36.5 C) (Oral)   Resp 20   Ht 5\' 7"  (1.702 m)   Wt 65.8 kg (145 lb)   SpO2 96%   BMI 22.71 kg/m   Physical Exam  Constitutional: He is oriented to person, place, and time. He appears well-developed and well-nourished. No distress.  HENT:  Head: Normocephalic and atraumatic.  Neck: Normal range of motion.  Cardiovascular: Normal rate, regular rhythm and normal heart sounds.  No murmur heard. Pulmonary/Chest: Effort normal and breath sounds  normal.  Abdominal: Soft. Bowel sounds are normal.  Musculoskeletal: Normal range of motion.  Able to stand, flex and extend lumbar spine without pain.  No midline spinal tenderness, locates a point 2cm lateral to lumbar spine with muscular tightness.   Neurological: He is alert and oriented to person, place, and time.  Skin: Skin is warm and dry.  Psychiatric: He has a normal mood and affect. His behavior is normal.     ED Treatments / Results  Labs (all labs ordered are listed, but only abnormal results are displayed) Labs Reviewed - No data to display  EKG None  Radiology No results found.  Procedures Procedures (including critical care time)  Medications Ordered in ED Medications - No data to display   Initial Impression / Assessment and Plan / ED Course  I have reviewed the triage vital signs and the nursing notes.  Pertinent labs & imaging results that were available during my care of the patient were reviewed by me and considered in my medical decision making (see chart for details).     Paraspinal muscle tenderness, patient specifically asked for Lidoderm patch, which is completely reasonable.  If pain persists, would consider muscle relaxer.  No red flag symptoms, reasonable to stay out of work for 1 to 2 days of rest.  Final Clinical Impressions(s) / ED Diagnoses   Final diagnoses:  Muscle spasm of back    ED Discharge Orders        Ordered    lidocaine (LIDODERM) 5 %  Every 24 hours     11/19/17 1335     Loni Muse, MD PGY 2 FM   Garth Bigness, MD 11/19/17 1428    Rolland Porter, MD 11/28/17 2312

## 2017-11-19 NOTE — ED Provider Notes (Signed)
Pt seen and evaluated. D/W Dr. Chanetta Marshallimberlake. Pt with localized para spinal tenderness on exam. No radiation, or other findings. No red flag sx or findings. Agree with Tx with Lidoderm patches. No indication for imaging or additional treatment.   Rolland PorterJames, Torie Towle, MD 11/19/17 1357

## 2017-11-19 NOTE — ED Triage Notes (Signed)
Mid back pain after pulling at work yesterday.

## 2017-11-20 NOTE — ED Notes (Signed)
Patient returned to ED for extension on worknote-resident who saw patient yesterday states okay for patient to be written out of work until Monday.  Patient given new worknote

## 2017-12-30 ENCOUNTER — Other Ambulatory Visit: Payer: Self-pay

## 2017-12-30 ENCOUNTER — Emergency Department (HOSPITAL_BASED_OUTPATIENT_CLINIC_OR_DEPARTMENT_OTHER): Payer: Medicare Other

## 2017-12-30 ENCOUNTER — Encounter (HOSPITAL_BASED_OUTPATIENT_CLINIC_OR_DEPARTMENT_OTHER): Payer: Self-pay | Admitting: Emergency Medicine

## 2017-12-30 ENCOUNTER — Emergency Department (HOSPITAL_BASED_OUTPATIENT_CLINIC_OR_DEPARTMENT_OTHER)
Admission: EM | Admit: 2017-12-30 | Discharge: 2017-12-30 | Disposition: A | Discharge status: Home / Self Care | Payer: Medicare Other | Attending: Emergency Medicine | Admitting: Emergency Medicine

## 2017-12-30 DIAGNOSIS — M79642 Pain in left hand: Secondary | ICD-10-CM | POA: Insufficient documentation

## 2017-12-30 DIAGNOSIS — J45909 Unspecified asthma, uncomplicated: Secondary | ICD-10-CM | POA: Insufficient documentation

## 2017-12-30 DIAGNOSIS — R0789 Other chest pain: Secondary | ICD-10-CM | POA: Insufficient documentation

## 2017-12-30 LAB — BASIC METABOLIC PANEL
Anion gap: 7 (ref 5–15)
BUN: 20 mg/dL (ref 8–23)
CHLORIDE: 105 mmol/L (ref 98–111)
CO2: 26 mmol/L (ref 22–32)
CREATININE: 1.05 mg/dL (ref 0.61–1.24)
Calcium: 9.1 mg/dL (ref 8.9–10.3)
GFR calc non Af Amer: >60 mL/min (ref >60)
Glucose, Bld: 125 mg/dL — ABNORMAL HIGH (ref 70–99)
POTASSIUM: 4.3 mmol/L (ref 3.5–5.1)
Sodium: 138 mmol/L (ref 135–145)

## 2017-12-30 LAB — CBC WITH DIFFERENTIAL/PLATELET
Basophils Absolute: 0 10*3/uL (ref 0.0–0.1)
Basophils Relative: 0 %
EOS ABS: 0.3 10*3/uL (ref 0.0–0.7)
Eosinophils Relative: 6 %
HCT: 45 % (ref 39.0–52.0)
HEMOGLOBIN: 15.6 g/dL (ref 13.0–17.0)
LYMPHS ABS: 1.9 10*3/uL (ref 0.7–4.0)
LYMPHS PCT: 36 %
MCH: 30.8 pg (ref 26.0–34.0)
MCHC: 34.7 g/dL (ref 30.0–36.0)
MCV: 88.9 fL (ref 78.0–100.0)
MONOS PCT: 12 %
Monocytes Absolute: 0.6 10*3/uL (ref 0.1–1.0)
NEUTROS PCT: 46 %
Neutro Abs: 2.6 10*3/uL (ref 1.7–7.7)
Platelets: 211 10*3/uL (ref 150–400)
RBC: 5.06 MIL/uL (ref 4.22–5.81)
RDW: 13.4 % (ref 11.5–15.5)
WBC: 5.4 10*3/uL (ref 4.0–10.5)

## 2017-12-30 LAB — TROPONIN I

## 2017-12-30 MED ORDER — ALBUTEROL SULFATE HFA 108 (90 BASE) MCG/ACT IN AERS
2.0000 | INHALATION_SPRAY | Freq: Once | RESPIRATORY_TRACT | Status: AC
  Administered 2017-12-30: 2 via RESPIRATORY_TRACT
  Filled 2017-12-30: qty 6.7

## 2017-12-30 NOTE — Discharge Instructions (Signed)
You were seen in the ED today with left hand pain. Your labs and x-rays are normal. 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 I am placing a removable splint to use for comfort. Follow up with the PCP listed. Return to the ED with any worsening lightheadedness or chest pain.

## 2017-12-30 NOTE — ED Triage Notes (Addendum)
Reports left hand pain which began for 5 days with knot to left thumb.  States the pain radiates up his left arm.  Also endorses dizziness that began last night if he is standing for more than 10 minutes.  Also reports he is out of his inhaler and has some shortness of breath.

## 2017-12-30 NOTE — ED Provider Notes (Signed)
Emergency Department Provider Note   I have reviewed the triage vital signs and the nursing notes.   HISTORY  Chief Complaint Hand Pain and Dizziness   HPI Brian Edwards is a 67 y.o. male with PMH of asthma and GERD presents to the ED for evaluation of left hand pain at the base of the left thumb. 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 He reports a painful area of swelling at the base of the left thumb. Yesterday he had radiation of pain up the left arm/shoulder and into the chest for approximately 5 minutes. He denies any chest pain this AM. No fever or chills. No injury to the arm/hand. He also notes a momentary episode of vertigo which lasted for only seconds and then resolved.   Patient also notes he is out of his albuterol and is having some increased SOB.   Past Medical History:  Diagnosis Date  . Asthma   . GERD (gastroesophageal reflux disease)     Patient Active Problem List   Diagnosis Date Noted  . Low back pain radiating to right leg 12/14/2012    History reviewed. No pertinent surgical history.   Allergies Patient has no known allergies.  Family History  Problem Relation Age of Onset  . Sudden death Neg Hx   . Hypertension Neg Hx   . Hyperlipidemia Neg Hx   . Heart attack Neg Hx   . Diabetes Neg Hx     Social History Social History   Tobacco Use  . Smoking status: Never Smoker  . Smokeless tobacco: Never Used  Substance Use Topics  . Alcohol use: No  . Drug use: No    Review of Systems  Constitutional: No fever/chills Eyes: No visual changes. ENT: No sore throat. Cardiovascular: Positive chest pain. Respiratory: Positive shortness of breath. Gastrointestinal: No abdominal pain.  No nausea, no vomiting.  No diarrhea.  No constipation. Genitourinary: Negative for dysuria. Musculoskeletal: Negative for back pain. Positive left hand pain.  Skin: Negative for rash. Neurological:  Negative for headaches, focal weakness or numbness.  10-point ROS otherwise negative.  ____________________________________________   PHYSICAL EXAM:  VITAL SIGNS: ED Triage Vitals  Enc Vitals Group     BP 12/30/17 1521 130/79     Pulse Rate 12/30/17 1521 71     Resp 12/30/17 1521 18     Temp 12/30/17 1521 97.8 F (36.6 C)     Temp Source 12/30/17 1521 Oral     SpO2 12/30/17 1521 97 %     Weight 12/30/17 1520 150 lb (68 kg)     Pain Score 12/30/17 1520 8   Constitutional: Alert and oriented. Well appearing and in no acute distress. Eyes: Conjunctivae are normal.  Head: Atraumatic. Nose: No congestion/rhinnorhea. Mouth/Throat: Mucous membranes are moist.  Neck: No stridor.  Cardiovascular: Normal rate, regular rhythm. Good peripheral circulation. Grossly normal heart sounds.   Respiratory: Normal respiratory effort.  No retractions. Lungs CTAB. Gastrointestinal: Soft and nontender. No distention.  Musculoskeletal: No lower extremity tenderness nor edema. No gross deformities of extremities. Positive pain at the base of the left thumb. No snuff box tenderness. No erythema or abscess/cellulitis.  Neurologic:  Normal speech and language. No gross focal neurologic deficits are appreciated.  Skin:  Skin is warm, dry and intact. No rash noted.  ____________________________________________   LABS (all labs ordered are listed, but only abnormal results are displayed)  Labs Reviewed  BASIC METABOLIC PANEL -  Abnormal; Notable for the following components:      Result Value   Glucose, Bld 125 (*)    All other components within normal limits  CBC WITH DIFFERENTIAL/PLATELET  TROPONIN I   ____________________________________________  EKG   EKG Interpretation  Date/Time:  Wednesday December 30 2017 15:36:12 EDT Ventricular Rate:  56 PR Interval:    QRS Duration: 95 QT Interval:  400 QTC Calculation: 386 R Axis:   16 Text Interpretation:  Sinus rhythm No STEMI.  Confirmed by  Alona BeneLong, Robin Petrakis [phone]([id number]) on 12/30/2017 3:41:52 PM       ____________________________________________  RADIOLOGY  Dg Chest 2 View  Result Date: 12/30/2017 CLINICAL DATA:  Left chest pain. EXAM: CHEST - 2 VIEW COMPARISON:  10/29/2015 FINDINGS: The cardiomediastinal silhouette is within normal limits. The lungs are well inflated and clear. There is no evidence of pleural effusion or pneumothorax. No acute osseous abnormality is identified. IMPRESSION: No active cardiopulmonary disease. Electronically Signed   By: Sebastian AcheAllen  Grady M.D.   On: 12/30/2017 15:59   Dg Hand Complete Left  Result Date: 12/30/2017 CLINICAL DATA:  67 year old male with left hand pain in the posterior 1st metacarpal area. Symptoms for 5 days. No known injury but repetitive work. EXAM: LEFT HAND - COMPLETE 3+ VIEW COMPARISON:  None. FINDINGS: Bone mineralization is within normal limits. Distal radius and ulna are intact. Carpal bone alignment and joint spaces are normal. There is mild to moderate joint space loss plus subchondral sclerosis at the [facility] (basal joint of the left thumb) with some osteophytosis. The other CMC joints are normal. The metacarpals and phalanges are intact. The remaining joint spaces are normal for age. No soft tissue abnormality. 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 No other osseous abnormality identified in the left hand. Electronically Signed   By: Odessa FlemingH  Hall M.D.   On: 12/30/2017 15:58    ____________________________________________   PROCEDURES  Procedure(s) performed:   Procedures  None ____________________________________________   INITIAL IMPRESSION / ASSESSMENT AND PLAN / ED COURSE  Pertinent labs & imaging results that were available during my care of the patient were reviewed by me and considered in my medical decision making (see chart for details).  Patient presents to the ED with left hand pain radiating up into the arm/shoulder. He had 5 min of chest pain last  night. Some tenderness at the base of the left thumb. Suspect tendonitis 2/2 repetitive movement. Plan for velcro splint and Motrin. Will obtain plain film of the chest and hand along with labs (troponin) with CP last night.   04:15 PM Plain film of the left hand shows osteoarthritis.  Chest x-ray normal.  Labs including troponin normal.  Plan for thumb spica Velcro splint and discharge home.   At this time, I do not feel there is any life-threatening condition present. I have reviewed and discussed all results (EKG, imaging, lab, urine as appropriate), exam findings with patient. I have reviewed nursing notes and appropriate previous records.  I feel the patient is safe to be discharged home without further emergent workup. Discussed usual and customary return precautions. Patient and family (if present) verbalize understanding and are comfortable with this plan.  Patient will follow-up with their primary care provider. If they do not have a primary care provider, information for follow-up has been provided to them. All questions have been answered.  ____________________________________________  FINAL CLINICAL IMPRESSION(S) / ED DIAGNOSES  Final diagnoses:  Left hand pain  Chest wall pain  MEDICATIONS GIVEN DURING THIS VISIT:  Medications  albuterol (PROVENTIL HFA;VENTOLIN HFA) 108 (90 Base) MCG/ACT inhaler 2 puff (2 puffs Inhalation Given 12/30/17 1537)    Note:  This document was prepared using Dragon voice recognition software and may include unintentional dictation errors.  Alona Bene, MD Emergency Medicine    Tu Bayle, Arlyss Repress, MD 12/30/17 [phone]

## 2017-12-30 NOTE — ED Notes (Signed)
Pt on monitor 

## 2017-12-30 NOTE — ED Notes (Signed)
Information given for [employer] [facility] [facility]

## 2018-03-18 ENCOUNTER — Emergency Department (HOSPITAL_BASED_OUTPATIENT_CLINIC_OR_DEPARTMENT_OTHER)
Admission: EM | Admit: 2018-03-18 | Discharge: 2018-03-18 | Disposition: A | Discharge status: Home / Self Care | Payer: Medicare Other | Attending: Emergency Medicine | Admitting: Emergency Medicine

## 2018-03-18 ENCOUNTER — Encounter (HOSPITAL_BASED_OUTPATIENT_CLINIC_OR_DEPARTMENT_OTHER): Payer: Self-pay | Admitting: *Deleted

## 2018-03-18 ENCOUNTER — Emergency Department (HOSPITAL_BASED_OUTPATIENT_CLINIC_OR_DEPARTMENT_OTHER): Payer: Medicare Other

## 2018-03-18 ENCOUNTER — Other Ambulatory Visit: Payer: Self-pay

## 2018-03-18 DIAGNOSIS — L03012 Cellulitis of left finger: Secondary | ICD-10-CM | POA: Insufficient documentation

## 2018-03-18 DIAGNOSIS — M79645 Pain in left finger(s): Secondary | ICD-10-CM | POA: Diagnosis present

## 2018-03-18 DIAGNOSIS — Z79899 Other long term (current) drug therapy: Secondary | ICD-10-CM | POA: Diagnosis not present

## 2018-03-18 DIAGNOSIS — J45909 Unspecified asthma, uncomplicated: Secondary | ICD-10-CM | POA: Insufficient documentation

## 2018-03-18 MED ORDER — LIDOCAINE HCL (PF) 1 % IJ SOLN
5.0000 mL | Freq: Once | INTRAMUSCULAR | Status: AC
  Administered 2018-03-18: 5 mL
  Filled 2018-03-18: qty 5

## 2018-03-18 MED ORDER — CEPHALEXIN 500 MG PO CAPS: 500.0000 mg | ORAL_CAPSULE | Freq: Four times a day (QID) | ORAL | 0 refills | Status: AC

## 2018-03-18 MED FILL — CEPHALEXIN 500 MG CAPSULE: 500 | 7 days supply | Qty: 28 | Fill #0

## 2018-03-18 NOTE — ED Notes (Signed)
Patient transported to X-ray 

## 2018-03-18 NOTE — ED Provider Notes (Signed)
[facility] EMERGENCY DEPARTMENT Provider Note   CSN: [id number] Arrival date & time: 03/18/18  1034     History   Chief Complaint Chief Complaint  Patient presents with  . Finger Injury    HPI Brian Edwards is a 67 y.o. male presenting for swelling and pain to the tip of his left ring finger that began 2 days ago.  Patient denies injury or trauma to the area and states that the pain and swelling has increased gradually over the last 2 days.  Patient states that he is able to flex and extend his finger without pain.  Patient describes the pain to the tip of his finger as a constant throbbing pain that is moderate in intensity and worsened with palpation to the tip of the finger.  Patient denies having similar symptoms in the past.  Patient has not used anything for his pain.  Patient denies fever, discharge, nausea/vomiting or any other symptoms at this time.  States that he is feeling well aside from the pain to the tip of his finger. HPI  Past Medical History:  Diagnosis Date  . Asthma   . GERD (gastroesophageal reflux disease)     Patient Active Problem List   Diagnosis Date Noted  . Low back pain radiating to right leg 12/14/2012    History reviewed. No pertinent surgical history.      Home Medications    Prior to Admission medications   Medication Sig Start Date End Date Taking? Authorizing Provider  Fluticasone-Salmeterol (ADVAIR) 100-50 MCG/DOSE AEPB Inhale 1 puff into the lungs every 12 (twelve) hours. 10/29/15  Yes Leta Baptist, MD  Ipratropium-Albuterol (COMBIVENT IN) Inhale into the lungs.     Yes [provider]  albuterol (PROVENTIL) (2.5 MG/3ML) 0.083% nebulizer solution Take 3 mLs (2.5 mg total) by nebulization every 6 (six) hours as needed for wheezing. 10/29/15 10/28/16  Leta Baptist, MD  cephALEXin (KEFLEX) 500 MG capsule Take 1 capsule (500 mg total) by mouth 4 (four) times daily for 7 days. 03/18/18 03/25/18  Harlene Salts A, PA-C  esomeprazole (NEXIUM) 40 MG capsule Take 40 mg by mouth daily before breakfast.      [provider]  HYDROcodone-acetaminophen (NORCO/VICODIN) 5-325 MG per tablet Take 1 tablet by mouth every 6 (six) hours as needed for pain. 12/13/12   Hudnall, Azucena Fallen, MD  lidocaine (LIDODERM) 5 % Place 1 patch onto the skin daily. Remove & Discard patch within 12 hours or as directed by MD 11/19/17   Garth Bigness, MD  meloxicam (MOBIC) 15 MG tablet Take 1 tablet (15 mg total) by mouth daily. With food.  Start day AFTER finishing prednisone. 12/13/12   Hudnall, Azucena Fallen, MD  naproxen (NAPROSYN) 500 MG tablet Take 1 tablet (500 mg total) by mouth 2 (two) times daily. 08/31/17   Petrucelli, Samantha R, PA-C  pantoprazole (PROTONIX) 20 MG tablet Take 2 tablets (40 mg total) by mouth daily. 03/26/17 08/22/18  Nira Conn, MD  predniSONE (DELTASONE) 20 MG tablet Take 2 tablets (40 mg total) by mouth daily. 10/29/15   Leta Baptist, MD    Family History Family History  Problem Relation Age of Onset  . Sudden death Neg Hx   . Hypertension Neg Hx   . Hyperlipidemia Neg Hx   . Heart attack Neg Hx   . Diabetes Neg Hx     Social History Social History   Tobacco Use  . Smoking status: Never Smoker  .  Smokeless tobacco: Never Used  Substance Use Topics  . Alcohol use: No  . Drug use: No     Allergies   Patient has no known allergies.   Review of Systems Review of Systems  Constitutional: Negative.  Negative for chills and fever.  Musculoskeletal: Negative.  Negative for joint swelling.  Skin: Positive for color change.  Neurological: Negative.  Negative for weakness and numbness.   Physical Exam Updated Vital Signs BP 123/79 (BP Location: Right Arm)   Pulse (!) 52   Temp 97.8 F (36.6 C) (Oral)   Resp 18   Ht 5' 5.5" (1.664 m)   Wt 66.2 kg   SpO2 97%   BMI 23.93 kg/m   Physical Exam  Constitutional: He appears well-developed and well-nourished. No  distress.  HENT:  Head: Normocephalic and atraumatic.  Right Ear: External ear normal.  Left Ear: External ear normal.  Nose: Nose normal.  Eyes: Pupils are equal, round, and reactive to light. EOM are normal.  Neck: Trachea normal and normal range of motion. No tracheal deviation present.  Pulmonary/Chest: Effort normal. No respiratory distress.  Abdominal: Soft. There is no tenderness. There is no rebound and no guarding.  Musculoskeletal: Normal range of motion.       Hands: Left hand: Patient with mild swelling and erythema to distal tip of left fourth finger.  Skin intact. Tenderness to palpation over distal tip of fourth finger.  No snuffbox tenderness to palpation. No tenderness to palpation over flexor sheath.  Finger adduction/abduction intact with 5/5 strength.  Thumb opposition intact. Full active and resisted ROM to flexion/extension at wrist, MCP, PIP and DIP of all fingers.  FDS/FDP intact. Grip 5/5 strength.  Radial artery 2+ with <2sec cap refill in all fingers.  Sensation intact to light-tough in median/ulnar/radial distributions.  Possible early paronychia present.  Neurological: He is alert. He has normal strength. No sensory deficit. GCS eye subscore is 4. GCS verbal subscore is 5. GCS motor subscore is 6.  Speech is clear and goal oriented, follows commands Major Cranial nerves without deficit, no facial droop Normal strength strong and equal grip strength Sensation normal to light and sharp touch Moves extremities without ataxia, coordination intact Normal gait  Skin: Skin is warm and dry. Capillary refill takes less than 2 seconds.  Psychiatric: He has a normal mood and affect. His behavior is normal.   ED Treatments / Results  Labs (all labs ordered are listed, but only abnormal results are displayed) Labs Reviewed - No data to display  EKG None  Radiology Dg Hand Complete Left  Result Date: 03/18/2018 CLINICAL DATA:  Pain and swelling distal ring  finger EXAM: LEFT HAND - COMPLETE 3+ VIEW COMPARISON:  12/30/2017 FINDINGS: No evidence for an acute fracture. No subluxation or dislocation. No worrisome lytic or sclerotic osseous abnormality. Degenerative changes noted in the first carpometacarpal joint. Soft tissue swelling noted distal ring finger without underlying radiopaque soft tissue foreign body or bony abnormality. IMPRESSION: Distal ring finger swelling without underlying radiopaque soft tissue foreign body or bony abnormality. Electronically Signed   By: Kennith Center M.D.   On: 03/18/2018 11:51    Procedures .Marland KitchenIncision and Drainage Date/Time: 03/18/2018 1:12 PM Performed by: Bill Salinas, PA-C Authorized by: Bill Salinas, PA-C   Consent:    Consent obtained:  Verbal   Consent given by:  Patient   Risks discussed:  Bleeding, damage to other organs, incomplete drainage, infection and pain Location:    Indications for  incision and drainage: Paronychia.   Location:  Upper extremity   Upper extremity location:  Finger   Finger location:  L ring finger Pre-procedure details:    Skin preparation:  Betadine Anesthesia (see MAR for exact dosages):    Anesthesia method:  Nerve block   Block needle gauge:  27 G   Block anesthetic:  Lidocaine 1% w/o epi   Block technique:  Digital block   Block injection procedure:  Anatomic landmarks identified, anatomic landmarks palpated, negative aspiration for blood, introduced needle and incremental injection   Block outcome:  Anesthesia achieved Procedure type:    Complexity:  Simple Procedure details:    Incision types:  Single straight   Scalpel blade:  15   Drainage:  Purulent   Drainage amount:  Scant   Wound treatment:  Wound left open Post-procedure details:    Patient tolerance of procedure:  Tolerated well, no immediate complications   (including critical care time)  Medications Ordered in ED Medications  lidocaine (PF) (XYLOCAINE) 1 % injection 5 mL (5 mLs  Infiltration Given 03/18/18 1220)     Initial Impression / Assessment and Plan / ED Course  I have reviewed the triage vital signs and the nursing notes.  Pertinent labs & imaging results that were available during my care of the patient were reviewed by me and considered in my medical decision making (see chart for details).  Clinical Course as of Mar 18 1317  Thu Mar 18, 2018  1214 Dr. Charm Barges to see patient.   [BM]  1216 He is here with left index finger swelling and pain that started yesterday he denies any trauma.  Says the pain is kept him up at night and is throbbing in nature and radiating up his arm.  He recalls no trauma.  On exam he looks like he has an early paronychia starting.  Not sure that draining it would help at this time point but I think it would be reasonable to try.  Antibiotics and follow-up.   [MB]    Clinical Course User Index [BM] Bill Salinas, PA-C [MB] Terrilee Files, MD   Patient presenting with 2 days of swelling and pain to the distal tip of left fourth finger.  Imaging shows swelling without foreign body or bony abnormality.  Successful drainage of left fourth finger paronychia as above.  Patient encouraged warm soaks and to keep the area clean and covered with sterile dressing.  Patient has been given Keflex due to surrounding erythema and swelling to tip of finger, mild cellulitis.  Pad is soft and without tenderness, doubt felon at this time.  Patient afebrile, not tachycardic, not hypotensive, well-appearing in no acute distress.  Patient neurovascular intact to bilateral upper extremities with full range of motion 5/5 strength.  Sensation light touch intact.  Patient has been seen and evaluated by Dr. Charm Barges with plan of care at this time, discharged with Keflex and memory care follow-up.  At this time there does not appear to be any evidence of an acute emergency medical condition and the patient appears stable for discharge with  appropriate outpatient follow up. Diagnosis was discussed with patient who verbalizes understanding of care plan and is agreeable to discharge. I have discussed return precautions with patient who verbalizes understanding of return precautions. Patient strongly encouraged to follow-up with their PCP. All questions answered.   Note: Portions of this report may have been transcribed using voice recognition software. Every effort was made to ensure accuracy; however,  inadvertent computerized transcription errors may still be present.   Final Clinical Impressions(s) / ED Diagnoses   Final diagnoses:  Paronychia of left ring finger    ED Discharge Orders         Ordered    cephALEXin (KEFLEX) 500 MG capsule  4 times daily     03/18/18 1315           Elizabeth Palau 03/18/18 1424    Terrilee Files, MD 03/18/18 [phone]

## 2018-03-18 NOTE — Discharge Instructions (Signed)
Please return to the Emergency Department for any new or worsening symptoms or if your symptoms do not improve. Please be sure to follow up with your Primary Care Physician as soon as possible regarding your visit today. If you do not have a Primary Doctor please use the resources below to establish one. Please take the antibiotic medication Keflex as prescribed. Please continue to use warm soaks on the finger to allow for continued drainage.  You may also take a clean cotton swab and spread the nail away from the skin to allow continued drainage over the next few days. Please keep the area clean and covered with sterile dressings. Please follow-up with a medical professional for reevaluation of the area in the next few days.  Contact a health care provider if: Your symptoms get worse or do not improve with treatment. You have a fever or chills. You have redness spreading from the affected area. You have continued or increased fluid, blood, or pus coming from the affected area. Your finger or knuckle becomes swollen or is difficult to move.  Do not take your medicine if  develop an itchy rash, swelling in your mouth or lips, or difficulty breathing.   RESOURCE GUIDE  Chronic Pain Problems: Contact [facility]  [phone] Patients need to be referred by their primary care doctor.  Insufficient Money for Medicine: Contact United Way:  call "211" or [facility] 707-067-0076.  No Primary Care Doctor: Call [facility]  [phone] - can help you locate a primary care doctor that  accepts your insurance, provides certain services, etc. Physician Referral Service- [phone]  Agencies that provide inexpensive medical care: [facility]  [phone] [facility]  [phone] [facility]  [phone] [facility]  [phone] Planned Parenthood  [phone] [facility]  [phone]  Medicaid-accepting [facility]  Providers: [facility]- [address]  [phone], Mon-Fri 9am-7pm, Sat 9am-1pm [facility]- [address] [place], Suite [place]  [phone] [facility]- [address] [place]  [phone] [facility]- [address]  [phone] Renaye Rakers- [address] [place], [address], [phone]  Only accepts [place] patients after they have their name  applied to their card  Self Pay (no insurance) in [facility]: Sickle Cell Patients: Dr Willey Blade, [facility]  [address] [place], [phone] [facility]- [address] [place]  [phone]       [facility] [place]- [address]       -     [facility]- see information above (Speak to [employer] if you do not have insurance)       -  [facility]- [address] [place], [phone]       -  [facility]- [address] [place],  [phone]       -  [facility]- [address], [phone]       -  Dr Julio Sicks-  [address], [address], [place], [phone]       -  [facility]- [address], [phone]       -  [facility]- [address], [phone], also [address] [phone]       -  [facility]- [address] [place], [phone], 1st & 3rd Saturday   every month, 10am-1pm  1) Find a Doctor and Pay Out of Pocket Although you won't have to find out who is covered by your insurance plan, it is a good idea to ask around and get recommendations. You will then need to call the office and see if the doctor you have chosen will accept you as a new patient and what types of options they offer for patients who are self-pay. Some doctors offer discounts or will set up payment plans for their patients who do not have insurance, but you will need to ask so you aren't surprised when you get to your appointment.  2) Contact Your  Local Health Department Not all health departments have doctors that can see patients for sick visits, but many do, so it is worth a call to see if yours does. If you don't know where your local health department is, you can check in your phone book. The CDC also has a tool to help you locate your state's health department, and many state websites also have listings of all of their local health departments.  3) Find a Walk-in Clinic If your illness is not likely to be very severe or complicated, you may want to try a walk in clinic. These are popping up all over the country in pharmacies, drugstores, and shopping centers. They're usually staffed by nurse practitioners or physician assistants that have been trained to treat common illnesses and complaints. They're usually fairly quick and inexpensive. However, if you have serious medical issues or chronic medical problems, these are probably not your best option  STD Testing [facility] Department of [facility] [place], STD Clinic, [address] [place], phone [phone] or [phone].  Monday - Friday, call for an appointment. [facility] Department of [employer], STD Clinic, [place] [address], [place], phone [phone] or [phone].  Monday - Friday, call for an appointment.  Abuse/Neglect: [facility] Child Abuse Hotline 762-270-4263 [facility] Child Abuse Hotline (620)487-7480 (After Hours)  Emergency Shelter:  [facility] 270-591-5727  Maternity Homes: Room at the [place] [facility] (510) 675-5409 [facility] (256) 372-1643  MRSA Hotline #:   514 252 1696  [facility] Resources  [facility]  United [employer] [place] Dept. [address]                 [address]         [address]  Blondell Reveal Phone:  [phone]                                   Phone:  [phone]                   Phone:  [phone]  [facility], [phone] [facility] [phone]       -  [facility] in [place], [address],                                  [place] [phone] or [phone] (After Hours)   [place]  Substance Abuse Resources: Alcohol and Drug Services  [place] 619-351-1594 [facility] (248)473-6953 Residential & Outpatient Substance Abuse Program  201-639-3169  Psychological Services: [place]  [phone] [place]  [place], [place] [address], [place], [place]: [phone] or [phone], [employer]  Dental Assistance  If unable to pay or uninsured, contact:  [facility] or [facility]. to become qualified for the adult dental clinic.  Patients with Medicaid: [facility] [phone] [address], [place] [address] [phone]  If unable to pay, or uninsured, contact [facility] (941)725-7300) or [place] [phone] in [place], [place] in [facility]) to become qualified for the adult dental clinic   Other [place], [place], [place], [address], [place], [place], 2nd and 4th Thursday of the month at 6:30am.  10 clients each day by appointment, can sometimes see walk-in patients if someone does not show for an appointment. [facility]- [address] Hillard Danker [place], [place], [address], [place], [place], [place], [address], [place] Department- [phone] [facility] [phone]

## 2018-03-18 NOTE — ED Triage Notes (Signed)
Pts ring finger on his left hand is swelling and throbbing X 2 days. He doesn't recall any trauma to finger, so is uncertain why this is happening.

## 2018-08-06 ENCOUNTER — Emergency Department (HOSPITAL_BASED_OUTPATIENT_CLINIC_OR_DEPARTMENT_OTHER)
Admission: EM | Admit: 2018-08-06 | Discharge: 2018-08-06 | Disposition: A | Discharge status: Home / Self Care | Payer: Medicare Other | Attending: Emergency Medicine | Admitting: Emergency Medicine

## 2018-08-06 ENCOUNTER — Encounter (HOSPITAL_BASED_OUTPATIENT_CLINIC_OR_DEPARTMENT_OTHER): Payer: Self-pay | Admitting: Emergency Medicine

## 2018-08-06 ENCOUNTER — Emergency Department (HOSPITAL_BASED_OUTPATIENT_CLINIC_OR_DEPARTMENT_OTHER): Payer: Medicare Other

## 2018-08-06 ENCOUNTER — Other Ambulatory Visit: Payer: Self-pay

## 2018-08-06 DIAGNOSIS — Z79899 Other long term (current) drug therapy: Secondary | ICD-10-CM | POA: Diagnosis not present

## 2018-08-06 DIAGNOSIS — M778 Other enthesopathies, not elsewhere classified: Secondary | ICD-10-CM

## 2018-08-06 DIAGNOSIS — M779 Enthesopathy, unspecified: Secondary | ICD-10-CM | POA: Insufficient documentation

## 2018-08-06 DIAGNOSIS — J45909 Unspecified asthma, uncomplicated: Secondary | ICD-10-CM | POA: Insufficient documentation

## 2018-08-06 DIAGNOSIS — M79642 Pain in left hand: Secondary | ICD-10-CM | POA: Diagnosis present

## 2018-08-06 NOTE — Discharge Instructions (Addendum)
Follow up with your workers comp provider for recheck in 2 days. Wear splint. Take Motrin and Tylenol as directed. Alternate ice/heat to area for 20 minutes at a time each, 3 times daily.

## 2018-08-06 NOTE — ED Triage Notes (Signed)
Injured left hand while at work, yesterday, swelling noted

## 2018-08-06 NOTE — ED Provider Notes (Signed)
[facility] EMERGENCY DEPARTMENT Provider Note   CSN: [id number] Arrival date & time: 08/06/18  7169    History   Chief Complaint Chief Complaint  Patient presents with  . Hand Injury    HPI Pake Dietzen is a 68 y.o. male.     68 year old male with complaint of left hand/wrist pain.  Patient reports right hand fractures, has been using his left hand more while at work.  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  Denies specific injury at that time however reports having pain and swelling along his left thumb and thenar eminence since that time.  Patient took Tylenol without improvement.  Patient reports similar problem previously which was treated with 3 injections.  Patient states the company doctor was not in the office today and he was advised to come to the emergency room for an x-ray and evaluation.     Past Medical History:  Diagnosis Date  . Asthma   . GERD (gastroesophageal reflux disease)     Patient Active Problem List   Diagnosis Date Noted  . Low back pain radiating to right leg 12/14/2012    History reviewed. No pertinent surgical history.      Home Medications    Prior to Admission medications   Medication Sig Start Date End Date Taking? Authorizing Provider  albuterol (PROVENTIL) (2.5 MG/3ML) 0.083% nebulizer solution Take 3 mLs (2.5 mg total) by nebulization every 6 (six) hours as needed for wheezing. 10/29/15 10/28/16  Leta Baptist, MD  esomeprazole (NEXIUM) 40 MG capsule Take 40 mg by mouth daily before breakfast.      [provider]  Fluticasone-Salmeterol (ADVAIR) 100-50 MCG/DOSE AEPB Inhale 1 puff into the lungs every 12 (twelve) hours. 10/29/15   Leta Baptist, MD  Ipratropium-Albuterol (COMBIVENT IN) Inhale into the lungs.      [provider]  lidocaine (LIDODERM) 5 % Place 1 patch onto the skin daily. Remove & Discard patch within 12  hours or as directed by MD 11/19/17   Garth Bigness, MD  naproxen (NAPROSYN) 500 MG tablet Take 1 tablet (500 mg total) by mouth 2 (two) times daily. 08/31/17   Petrucelli, Samantha R, PA-C  pantoprazole (PROTONIX) 20 MG tablet Take 2 tablets (40 mg total) by mouth daily. 03/26/17 08/22/18  Nira Conn, MD    Family History Family History  Problem Relation Age of Onset  . Sudden death Neg Hx   . Hypertension Neg Hx   . Hyperlipidemia Neg Hx   . Heart attack Neg Hx   . Diabetes Neg Hx     Social History Social History   Tobacco Use  . Smoking status: Never Smoker  . Smokeless tobacco: Never Used  Substance Use Topics  . Alcohol use: No  . Drug use: No     Allergies   Patient has no known allergies.   Review of Systems Review of Systems  Constitutional: Negative for fever.  Musculoskeletal: Positive for arthralgias, joint swelling and myalgias.  Skin: Negative for color change, rash and wound.  Allergic/Immunologic: Negative for immunocompromised state.  Neurological: Negative for weakness and numbness.  Hematological: Does not bruise/bleed easily.  Psychiatric/Behavioral: Negative for self-injury.  All other systems reviewed and are negative.    Physical Exam Updated Vital Signs BP (!) 158/97 (BP Location: Left Arm)   Pulse 74   Temp 97.7 F (36.5 C) (Oral)   Resp 18  Ht 5\' 6"  (1.676 m)   Wt 68 kg   SpO2 100%   BMI 24.21 kg/m   Physical Exam Vitals signs and nursing note reviewed.  Constitutional:      General: He is not in acute distress.    Appearance: He is well-developed. He is not diaphoretic.  HENT:     Head: Normocephalic and atraumatic.  Cardiovascular:     Pulses: Normal pulses.  Pulmonary:     Effort: Pulmonary effort is normal.  Musculoskeletal:        General: Swelling and tenderness present. No deformity.     Left hand: He exhibits decreased range of motion, tenderness, bony tenderness and swelling. He exhibits normal  capillary refill, no deformity and no laceration. Normal sensation noted.       Hands:  Skin:    General: Skin is warm and dry.     Findings: No erythema or rash.  Neurological:     Mental Status: He is alert and oriented to person, place, and time.     Sensory: No sensory deficit.  Psychiatric:        Behavior: Behavior normal.      ED Treatments / Results  Labs (all labs ordered are listed, but only abnormal results are displayed) Labs Reviewed - No data to display  EKG None  Radiology Dg Hand Complete Left  Result Date: 08/06/2018 CLINICAL DATA:  Left hand injury at work yesterday. EXAM: LEFT HAND - COMPLETE 3+ VIEW COMPARISON:  Radiographs of March 18, 2018. FINDINGS: There is no evidence of fracture or dislocation. Moderate degenerative changes seen involving the first carpometacarpal joint. Soft tissues are unremarkable. IMPRESSION: Moderate osteoarthritis of first carpometacarpal joint. No acute abnormality seen in the left hand. Electronically Signed   By: Lupita Raider, M.D.   On: 08/06/2018 10:15    Procedures Procedures (including critical care time)  Medications Ordered in ED Medications - No data to display   Initial Impression / Assessment and Plan / ED Course  I have reviewed the triage vital signs and the nursing notes.  Pertinent labs & imaging results that were available during my care of the patient were reviewed by me and considered in my medical decision making (see chart for details).  Clinical Course as of Aug 06 1042  Fri Aug 06, 2018  3247 68 year old male with history of arthritis in his left hand presents with complaint of pain and swelling in his left hand after use of left hand at work.  Patient is using his left hand because he is right-hand restrictions following finger injuries on the right hand.  Patient has pain and swelling at the left [facility] with arthritic changes on x-ray.  Patient given wrist cock-up splint, advised to apply ice and  heat alternating for 20 minutes at a time.  Take Motrin and Tylenol for pain and follow-up with his Worker's Comp provider.   [LM]    Clinical Course User Index [LM] Jeannie Fend, PA-C      Final Clinical Impressions(s) / ED Diagnoses   Final diagnoses:  Left hand tendonitis    ED Discharge Orders    None       Alden Hipp 08/06/18 1044    Little, Ambrose Finland, MD 08/06/18 1511

## 2018-08-06 NOTE — ED Notes (Signed)
Pt verbalized understanding of dc instructions.

## 2019-08-02 IMAGING — CR DG HAND COMPLETE 3+V*L*
3 series · 3 of 3 positions shown · non-contrast
Comparison: None.

CLINICAL DATA: 66-year-old male with left hand pain in the
posterior 1st metacarpal area. Symptoms for 5 days. No known injury
but repetitive work.

EXAM:
LEFT HAND - COMPLETE 3+ VIEW

[x hand pa left]
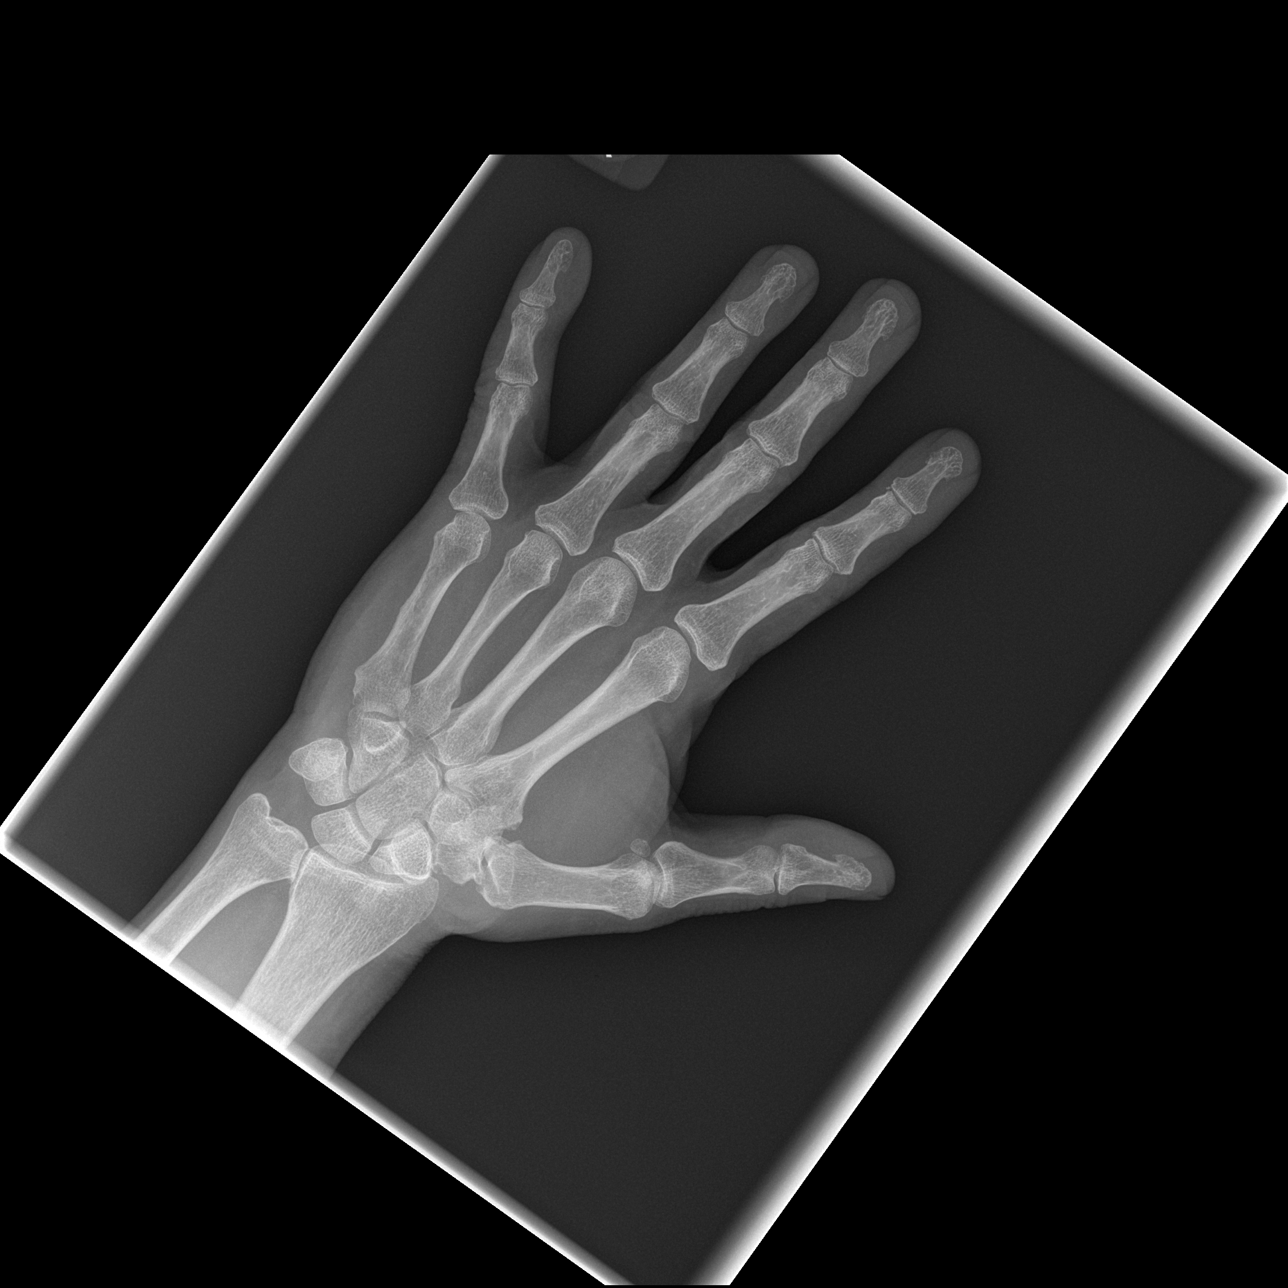

[x hand oblique left]
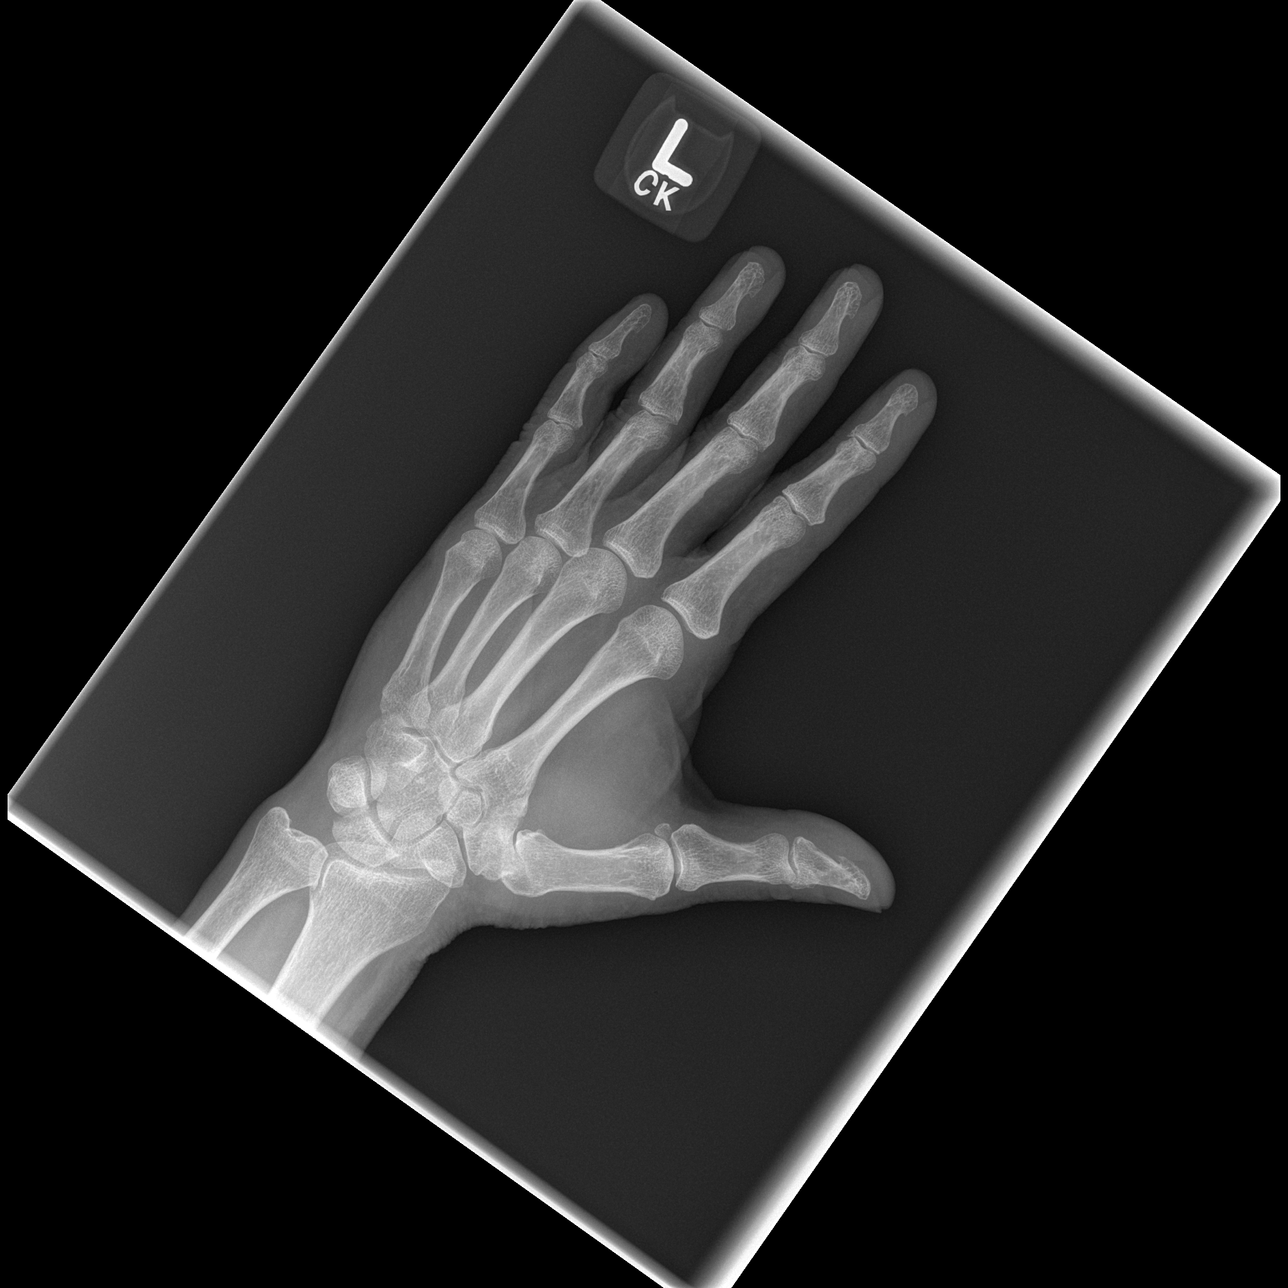

[x hand lat left]
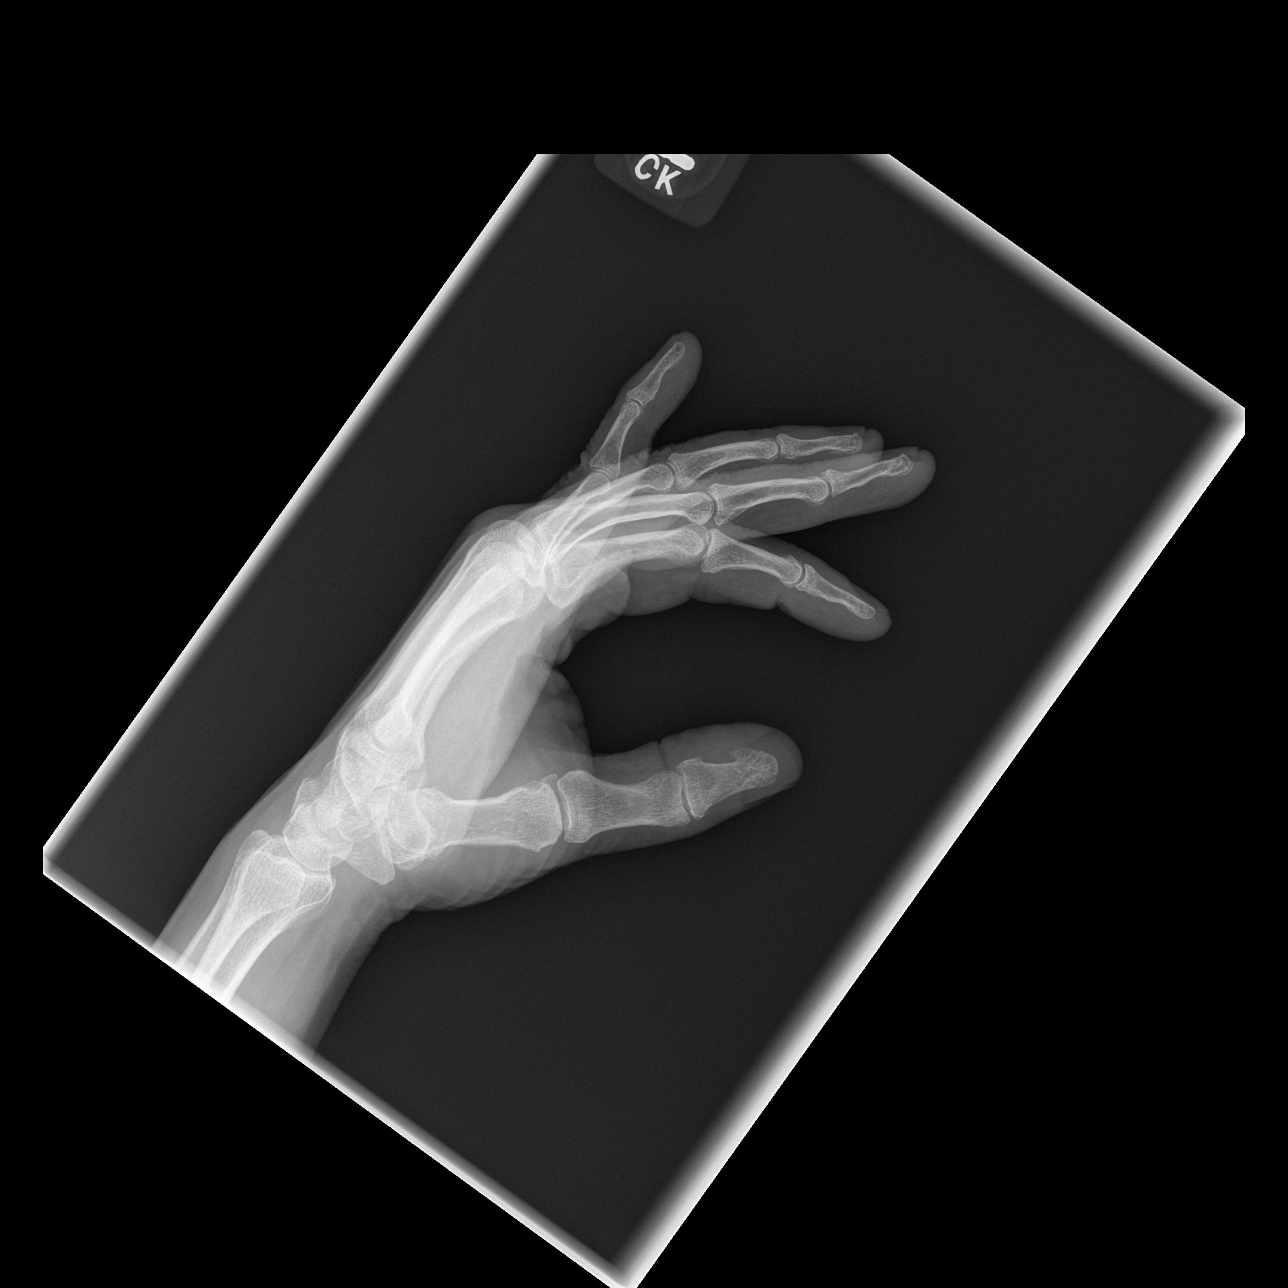

[3 of 3 positions shown; findings below may reference images not displayed]

FINDINGS: Bone mineralization is within normal limits. Distal radius and ulna
are intact. Carpal bone alignment and joint spaces are normal. There
is mild to moderate joint space loss plus subchondral sclerosis at
the 1st CMC joint (basal joint of the left thumb) with some
osteophytosis. The other CMC joints are normal. The metacarpals and
phalanges are intact. The remaining joint spaces are normal for age.
No soft tissue abnormality.
IMPRESSION: Osteoarthritis at the 1st CMC joint. No other osseous abnormality
identified in the left hand.

## 2021-10-30 ENCOUNTER — Telehealth: Payer: Self-pay

## 2021-10-30 ENCOUNTER — Other Ambulatory Visit
Admission: RE | Admit: 2021-10-30 | Discharge: 2021-10-30 | Disposition: A | Discharge status: Home / Self Care | Payer: Medicare HMO | Source: Ambulatory Visit | Attending: Surgery | Admitting: Surgery

## 2021-10-30 ENCOUNTER — Ambulatory Visit (INDEPENDENT_AMBULATORY_CARE_PROVIDER_SITE_OTHER): Payer: Medicare HMO | Admitting: Surgery

## 2021-10-30 ENCOUNTER — Ambulatory Visit
Admission: RE | Admit: 2021-10-30 | Discharge: 2021-10-30 | Disposition: A | Discharge status: Home / Self Care | Payer: Medicare HMO | Source: Ambulatory Visit | Attending: Surgery | Admitting: Surgery

## 2021-10-30 ENCOUNTER — Encounter: Payer: Self-pay | Admitting: Surgery

## 2021-10-30 VITALS — BP 112/69 | HR 64 | Temp 98.7°F | Ht 72.0 in | Wt 150.8 lb

## 2021-10-30 DIAGNOSIS — R1032 Left lower quadrant pain: Secondary | ICD-10-CM | POA: Diagnosis not present

## 2021-10-30 DIAGNOSIS — R109 Unspecified abdominal pain: Secondary | ICD-10-CM | POA: Insufficient documentation

## 2021-10-30 LAB — COMPREHENSIVE METABOLIC PANEL
ALT: 14 U/L (ref 0–44)
AST: 16 U/L (ref 15–41)
Albumin: 3.8 g/dL (ref 3.5–5.0)
Alkaline Phosphatase: 62 U/L (ref 38–126)
Anion gap: 7 (ref 5–15)
BUN: 10 mg/dL (ref 8–23)
CO2: 28 mmol/L (ref 22–32)
Calcium: 9.6 mg/dL (ref 8.9–10.3)
Chloride: 103 mmol/L (ref 98–111)
Creatinine, Ser: 1.11 mg/dL (ref 0.61–1.24)
GFR, Estimated: >60 mL/min (ref >60)
Glucose, Bld: 77 mg/dL (ref 70–99)
Potassium: 4.5 mmol/L (ref 3.5–5.1)
Sodium: 138 mmol/L (ref 135–145)
Total Bilirubin: 0.6 mg/dL (ref 0.3–1.2)
Total Protein: 7.4 g/dL (ref 6.5–8.1)

## 2021-10-30 LAB — CBC
HCT: 46.9 % (ref 39.0–52.0)
Hemoglobin: 15.5 g/dL (ref 13.0–17.0)
MCH: 30.6 pg (ref 26.0–34.0)
MCHC: 33 g/dL (ref 30.0–36.0)
MCV: 92.5 fL (ref 80.0–100.0)
Platelets: 271 10*3/uL (ref 150–400)
RBC: 5.07 MIL/uL (ref 4.22–5.81)
RDW: 14.1 % (ref 11.5–15.5)
WBC: 7.3 10*3/uL (ref 4.0–10.5)
nRBC: 0 % (ref 0.0–0.2)

## 2021-10-30 MED ORDER — IOHEXOL 300 MG/ML  SOLN
100.0000 mL | Freq: Once | INTRAMUSCULAR | Status: AC | PRN
  Administered 2021-10-30: 100 mL via INTRAVENOUS

## 2021-10-30 NOTE — Telephone Encounter (Signed)
-----   Message from Leafy Ro, MD sent at 10/30/2021  1:27 PM EDT ----- Please let pt and daughter in law know that lab work looked completely normal, no bleeding ----- Message ----- From: Leory Plowman, Lab In [place] Sent: 10/30/2021  11:35 AM EDT To: Leafy Ro, MD

## 2021-10-30 NOTE — Telephone Encounter (Signed)
Spoke with the patient's daughter in law and let her know that per Dr Everlene Farrier the CT scan was normal. No concerns found. He is to follow up as scheduled.

## 2021-10-30 NOTE — Telephone Encounter (Signed)
-----   Message from Jules Husbands, MD sent at 10/30/2021  3:36 PM EDT ----- Please let the pt and daughter in law know that CT was completely nml, may continue to follow up w me. No need for any immediate surgery ----- Message ----- From: Interface, Rad Results In Sent: 10/30/2021   1:38 PM EDT To: Jules Husbands, MD

## 2021-10-30 NOTE — Patient Instructions (Addendum)
Head over for your CT scan at [facility]. Go in the Medical Mall entrance and the Radiology department on your right.  Please get labs done.  If you have any concerns or questions, please feel free to call our office. See follow up appointment below.     Diverticulitis  Diverticulitis is when small pouches in your colon (large intestine) get infected or swollen. This causes pain in the belly (abdomen) and watery poop (diarrhea). These pouches are called diverticula. The pouches form in people who have a condition called diverticulosis. What are the causes? This condition may be caused by poop (stool) that gets trapped in the pouches in your colon. The poop lets germs (bacteria) grow in the pouches. This causes the infection. What increases the risk? You are more likely to get this condition if you have small pouches in your colon. The risk is higher if: You are overweight or very overweight (obese). You do not exercise enough. You drink alcohol. You smoke or use products with tobacco in them. You eat a diet that has a lot of red meat such as beef, pork, or lamb. You eat a diet that does not have enough fiber in it. You are older than 71 years of age. What are the signs or symptoms? Pain in the belly. Pain is often on the left side, but it may be in other areas. Fever and feeling cold. Feeling like you may vomit. Vomiting. Having cramps. Feeling full. Changes to how often you poop. Blood in your poop. How is this treated? Most cases are treated at home by: Taking over-the-counter pain medicines. Following a clear liquid diet. Taking antibiotic medicines. Resting. Very bad cases may need to be treated at a hospital. This may include: Not eating or drinking. Taking prescription pain medicine. Getting antibiotic medicines through an IV tube. Getting fluid and food through an IV tube. Having surgery. When you are feeling better, your doctor may tell you to  have a test to check your colon (colonoscopy). Follow these instructions at home: Medicines Take over-the-counter and prescription medicines only as told by your doctor. These include: Antibiotics. Pain medicines. Fiber pills. Probiotics. Stool softeners. If you were prescribed an antibiotic medicine, take it as told by your doctor. Do not stop taking the antibiotic even if you start to feel better. Ask your doctor if the medicine prescribed to you requires you to avoid driving or using machinery. Eating and drinking  Follow a diet as told by your doctor. When you feel better, your doctor may tell you to change your diet. You may need to eat a lot of fiber. Fiber makes it easier to poop (have a bowel movement). Foods with fiber include: Berries. Beans. Lentils. Green vegetables. Avoid eating red meat. General instructions Do not use any products that contain nicotine or tobacco, such as cigarettes, e-cigarettes, and chewing tobacco. If you need help quitting, ask your doctor. Exercise 3 or more times a week. Try to get 30 minutes each time. Exercise enough to sweat and make your heart beat faster. Keep all follow-up visits as told by your doctor. This is important. Contact a doctor if: Your pain does not get better. You are not pooping like normal. Get help right away if: Your pain gets worse. Your symptoms do not get better. Your symptoms get worse very fast. You have a fever. You vomit more than one time. You have poop that is: Bloody. Black. Tarry. Summary This condition happens when small pouches  in your colon get infected or swollen. Take medicines only as told by your doctor. Follow a diet as told by your doctor. Keep all follow-up visits as told by your doctor. This is important. This information is not intended to replace advice given to you by your health care provider. Make sure you discuss any questions you have with your health care provider. Document Revised:  03/07/2019 Document Reviewed: 03/07/2019 Elsevier Patient Education  2023 Else[employer]. [facility]. Go in the [place] and Radiology will be on your right.   If you have any concerns or questions, please feel free to call our office. See follow up appointment below.

## 2021-10-30 NOTE — Progress Notes (Signed)
Patient ID: Brian Edwards, male   DOB: March 23, 1951, 71 y.o.   MRN: [id number]  HPI Brian Edwards is a [age] y.o. male seen for subacute abdominal pain.  He is from [place].  He is accompanied by his daughter-in-law.  He reports some abdominal pain that has been present for couple weeks.  It is located in the left lower quadrant it is sharp and moderate intensity.  He does also feel overall weakness and some strange sensations in his legs and abdomen.  Apparently a few weeks in [place] he had similar issues associated with nausea and vomiting.  He associated the symptoms after drinking green tea. Currently in the hospital in [place] told him that they needed to do emergency surgery with hemorrhoids. I do not have any medical records.  Of note he does have a history of umbilical hernia repair.  He also is able to perform more than 4 METS of activity without any shortness of breath or chest pain. His prior labs to include a CBC and BMP were completely normal.  He is did have a chest x-ray from couple of years ago that was completely normal. Denies any heart attacks or any strokes. Currently he did have a history of gastritis He specifically denies any hematochezia or melena.  HPI  Past Medical History:  Diagnosis Date   Asthma    GERD (gastroesophageal reflux disease)    Surgical history: Open umbilical hernia repair  Family History  Problem Relation Age of Onset   Sudden death Neg Hx    Hypertension Neg Hx    Hyperlipidemia Neg Hx    Heart attack Neg Hx    Diabetes Neg Hx     Social History Social History   Tobacco Use   Smoking status: Never   Smokeless tobacco: Never  Substance Use Topics   Alcohol use: No   Drug use: No    No Known Allergies  Current Outpatient Medications  Medication Sig Dispense Refill   albuterol (PROVENTIL) (2.5 MG/3ML) 0.083% nebulizer solution Take 3 mLs (2.5 mg total) by nebulization every 6 (six) hours as needed for wheezing. 25 mL 12    esomeprazole (NEXIUM) 40 MG capsule Take 40 mg by mouth daily before breakfast.       Ipratropium-Albuterol (COMBIVENT IN) Inhale into the lungs.       naproxen (NAPROSYN) 500 MG tablet Take 1 tablet (500 mg total) by mouth 2 (two) times daily. 30 tablet 0   pantoprazole (PROTONIX) 20 MG tablet Take 2 tablets (40 mg total) by mouth daily. 60 tablet 1   No current facility-administered medications for this visit.     Review of Systems Full ROS  was asked and was negative except for the information on the HPI  Physical Exam Blood pressure 112/69, pulse 64, temperature 98.7 F (37.1 C), temperature source Oral, height 6' (1.829 m), weight 150 lb 12.8 oz (68.4 kg), SpO2 97 %. CONSTITUTIONAL: NAD. EYES: Pupils are equal, round Sclera are non-icteric. EARS, NOSE, MOUTH AND THROAT: T The oral mucosa is pink and moist. Hearing is intact to voice. LYMPH NODES:  Lymph nodes in the neck are normal. RESPIRATORY:  Lungs are clear. There is normal respiratory effort, with equal breath sounds bilaterally, and without pathologic use of accessory muscles. CARDIOVASCULAR: Heart is regular without murmurs, gallops, or rubs. GI: The abdomen is  soft, tender to palpation in the left lower quadrant.  No peritonitis no rebound, and nondistended. There are no palpable masses. There is no hepatosplenomegaly. There  are normal bowel sounds in all quadrants. Rectal: There is no evidence of any hemorrhoids there is no evidence of blood.  Rectal exam does not show any masses MUSCULOSKELETAL: Normal muscle strength and tone. No cyanosis or edema.   SKIN: Turgor is good and there are no pathologic skin lesions or ulcers. NEUROLOGIC: Motor and sensation is grossly normal. Cranial nerves are grossly intact. PSYCH:  Oriented to person, place and time. Affect is normal.  Data Reviewed  I have personally reviewed the patient's imaging, laboratory findings and medical records.    Assessment/Plan 71 year old male with  history of subacute left lower quadrant abdominal pain.  At this time I will need to start fresh work-up and I will perform a CT scan of the abdomen and pelvis.  On a clinical basis seems to be consistent with diverticulitis.  Regarding potential GI bleed I would like to do hemoglobin as well as CMP.  At this time he is not toxic and does not need any hospitalization or surgical intervention.  I will continue to follow him and he may require further additional endoscopic work-up.  I had an extensive discussion with the patient and the family.  Please note that I spent at least 60 minutes in this encounter including coordination of his care, personally reviewing records, performing appropriate documentation and  placing orders   Caroleen Hamman, MD [facility] 10/30/2021, 3:35 PM

## 2021-10-30 NOTE — Telephone Encounter (Signed)
Spoke with Rinaldo Cloud and let her know that the lab work was all normal. No bleeding per Dr Everlene Farrier.  We will call her with the CT results once they come back.

## 2021-11-11 ENCOUNTER — Ambulatory Visit (INDEPENDENT_AMBULATORY_CARE_PROVIDER_SITE_OTHER): Payer: Medicare HMO | Admitting: Surgery

## 2021-11-11 ENCOUNTER — Encounter: Payer: Self-pay | Admitting: Surgery

## 2021-11-11 VITALS — BP 104/63 | HR 64 | Temp 98.0°F | Ht 72.0 in | Wt 150.0 lb

## 2021-11-11 DIAGNOSIS — R109 Unspecified abdominal pain: Secondary | ICD-10-CM

## 2021-11-11 DIAGNOSIS — R1032 Left lower quadrant pain: Secondary | ICD-10-CM | POA: Diagnosis not present

## 2021-11-11 NOTE — Patient Instructions (Signed)
You should have a Colonoscopy done.

## 2021-11-11 NOTE — Progress Notes (Signed)
Outpatient Surgical Follow Up  11/11/2021  Johnthan Axtman is an 71 y.o. male.   Chief Complaint  Patient presents with   Follow-up    HPI: Mr Q following after he completed a CT scan and further work-up.  He continues to have some pain on the left lower quadrant but has significantly improved.  No fevers no chills .  He is tolerating diet.  No external bleeding.  He did have a CT scan that I have personally reviewed and discussed with the patient and the son no evidence of acute intra-abdominal abnormality.  CBC and CMP were completely normal. They are Relieved to hear the good news  Past Medical History:  Diagnosis Date   Asthma    GERD (gastroesophageal reflux disease)     No past surgical history on file.  Family History  Problem Relation Age of Onset   Sudden death Neg Hx    Hypertension Neg Hx    Hyperlipidemia Neg Hx    Heart attack Neg Hx    Diabetes Neg Hx     Social History:  reports that he has never smoked. He has never used smokeless tobacco. He reports that he does not drink alcohol and does not use drugs.  Allergies: No Known Allergies  Medications reviewed.    ROS Full ROS performed and is otherwise negative other than what is stated in HPI   BP 104/63   Pulse 64   Temp 98 F (36.7 C)   Ht 6' (1.829 m)   Wt 150 lb (68 kg)   SpO2 97%   BMI 20.34 kg/m   Physical Exam CONSTITUTIONAL: NAD. EYES: Pupils are equal, round Sclera are non-icteric. EARS, NOSE, MOUTH AND THROAT:  The oral mucosa is pink and moist. Hearing is intact to voice. LYMPH NODES:  Lymph nodes in the neck are normal. RESPIRATORY:  Lungs are clear. There is normal respiratory effort, with equal breath sounds bilaterally, and without pathologic use of accessory muscles. CARDIOVASCULAR: Heart is regular without murmurs, gallops, or rubs. GI: The abdomen is  soft, minimal tenderness LLQ, no peritonitis, no rebound.  There are no palpable masses. There is no hepatosplenomegaly. There  are normal bowel MUSCULOSKELETAL: Normal muscle strength and tone. No cyanosis or edema.   SKIN: Turgor is good and there are no pathologic skin lesions or ulcers. NEUROLOGIC: Motor and sensation is grossly normal. Cranial nerves are grossly intact. PSYCH:  Oriented to person, place and time. Affect is normal.    Assessment/Plan: 71 year old male with left lower quadrant abdominal pain that has improved since the last office visit.  There is no specific pathology.  This can certainly be worked up as an outpatient and I do recommend further evaluation by GI with a colonoscopy and upper endoscopy.  Discussed with the patient and also the son and they are in agreement.  They want to seek a GI provider close to [facility] where they performed.  At this point for any surgical intervention .  Please note that I spent 30 minutes in this encounter including coordination of his care, personally reviewing records, performing appropriate documentation and  placing orders   Sterling Big, MD [facility] General Surgeon

## 2021-11-14 ENCOUNTER — Telehealth: Payer: Self-pay

## 2021-11-14 NOTE — Telephone Encounter (Signed)
Faxed medical records to [facility] at [facility] at [phone].
